# Patient Record
Sex: Female | Born: 1960 | Race: White | Hispanic: No | Marital: Married | Smoking: Never smoker
Health system: Southern US, Community
[De-identification: ages and names within clinical notes are randomized; demographics above are authoritative.]

## PROBLEM LIST (undated history)

## (undated) DIAGNOSIS — E559 Vitamin D deficiency, unspecified: Secondary | ICD-10-CM

## (undated) DIAGNOSIS — R011 Cardiac murmur, unspecified: Secondary | ICD-10-CM

## (undated) DIAGNOSIS — E669 Obesity, unspecified: Secondary | ICD-10-CM

## (undated) DIAGNOSIS — M436 Torticollis: Secondary | ICD-10-CM

## (undated) DIAGNOSIS — E079 Disorder of thyroid, unspecified: Secondary | ICD-10-CM

## (undated) DIAGNOSIS — R002 Palpitations: Secondary | ICD-10-CM

## (undated) DIAGNOSIS — E538 Deficiency of other specified B group vitamins: Secondary | ICD-10-CM

## (undated) DIAGNOSIS — F411 Generalized anxiety disorder: Secondary | ICD-10-CM

## (undated) DIAGNOSIS — IMO0002 Reserved for concepts with insufficient information to code with codable children: Secondary | ICD-10-CM

## (undated) DIAGNOSIS — N6009 Solitary cyst of unspecified breast: Secondary | ICD-10-CM

## (undated) DIAGNOSIS — G629 Polyneuropathy, unspecified: Secondary | ICD-10-CM

## (undated) HISTORY — DX: Polyneuropathy, unspecified: G62.9

## (undated) HISTORY — DX: Vitamin D deficiency, unspecified: E55.9

## (undated) HISTORY — DX: Generalized anxiety disorder: F41.1

## (undated) HISTORY — DX: Deficiency of other specified B group vitamins: E53.8

## (undated) HISTORY — DX: Cardiac murmur, unspecified: R01.1

## (undated) HISTORY — DX: Palpitations: R00.2

## (undated) HISTORY — DX: Solitary cyst of unspecified breast: N60.09

## (undated) HISTORY — DX: Obesity, unspecified: E66.9

## (undated) HISTORY — DX: Disorder of thyroid, unspecified: E07.9

## (undated) HISTORY — DX: Reserved for concepts with insufficient information to code with codable children: IMO0002

## (undated) HISTORY — DX: Torticollis: M43.6

## (undated) HISTORY — PX: OTHER SURGICAL HISTORY: SHX169

---

## 2004-07-01 ENCOUNTER — Ambulatory Visit: Payer: Self-pay | Admitting: Unknown Physician Specialty

## 2005-10-19 ENCOUNTER — Ambulatory Visit: Payer: Self-pay | Admitting: Unknown Physician Specialty

## 2007-02-03 DIAGNOSIS — N6009 Solitary cyst of unspecified breast: Secondary | ICD-10-CM

## 2007-02-03 HISTORY — DX: Solitary cyst of unspecified breast: N60.09

## 2007-05-28 ENCOUNTER — Emergency Department: Payer: Self-pay | Admitting: Emergency Medicine

## 2007-05-28 ENCOUNTER — Other Ambulatory Visit: Payer: Self-pay

## 2007-12-06 ENCOUNTER — Encounter: Payer: Self-pay | Admitting: Family Medicine

## 2007-12-27 ENCOUNTER — Ambulatory Visit: Payer: Self-pay

## 2008-01-16 ENCOUNTER — Encounter: Payer: Self-pay | Admitting: Family Medicine

## 2009-08-27 ENCOUNTER — Emergency Department: Payer: Self-pay | Admitting: Emergency Medicine

## 2009-08-27 ENCOUNTER — Encounter: Payer: Self-pay | Admitting: Family Medicine

## 2009-08-27 LAB — CONVERTED CEMR LAB
BUN: 8 mg/dL
CO2: 25 meq/L
Chloride: 104 meq/L
Glucose, Bld: 88 mg/dL
HCT: 41.5 %
MCV: 93 fL
Platelets: 339 10*3/uL
Potassium: 3.6 meq/L
Sodium: 138 meq/L

## 2009-08-30 ENCOUNTER — Encounter: Payer: Self-pay | Admitting: Family Medicine

## 2009-08-30 LAB — CONVERTED CEMR LAB
AST: 12 units/L
Albumin: 4 g/dL
Alkaline Phosphatase: 60 units/L
Chloride: 103 meq/L
Creatinine, Ser: 0.7 mg/dL
GFR calc non Af Amer: >60 mL/min
Total Bilirubin: 0.6 mg/dL

## 2009-09-09 ENCOUNTER — Encounter: Payer: Self-pay | Admitting: Family Medicine

## 2009-10-01 ENCOUNTER — Encounter: Payer: Self-pay | Admitting: Family Medicine

## 2009-10-05 ENCOUNTER — Ambulatory Visit: Payer: Self-pay

## 2009-10-05 ENCOUNTER — Encounter: Payer: Self-pay | Admitting: Family Medicine

## 2009-10-10 ENCOUNTER — Telehealth: Payer: Self-pay | Admitting: Family Medicine

## 2009-10-11 ENCOUNTER — Encounter: Payer: Self-pay | Admitting: Family Medicine

## 2009-10-11 ENCOUNTER — Ambulatory Visit: Payer: Self-pay | Admitting: Internal Medicine

## 2009-10-11 DIAGNOSIS — R002 Palpitations: Secondary | ICD-10-CM | POA: Insufficient documentation

## 2009-10-23 ENCOUNTER — Encounter: Payer: Self-pay | Admitting: Family Medicine

## 2009-11-04 ENCOUNTER — Ambulatory Visit: Payer: Self-pay | Admitting: Internal Medicine

## 2009-11-04 DIAGNOSIS — E038 Other specified hypothyroidism: Secondary | ICD-10-CM | POA: Insufficient documentation

## 2009-11-04 DIAGNOSIS — F411 Generalized anxiety disorder: Secondary | ICD-10-CM | POA: Insufficient documentation

## 2009-11-04 DIAGNOSIS — E039 Hypothyroidism, unspecified: Secondary | ICD-10-CM

## 2009-11-05 LAB — CONVERTED CEMR LAB
Free T4: 0.81 ng/dL (ref 0.60–1.60)
T3, Free: 2.9 pg/mL (ref 2.3–4.2)
TSH: 2.15 microintl units/mL (ref 0.35–5.50)

## 2009-11-07 ENCOUNTER — Encounter: Payer: Self-pay | Admitting: Family Medicine

## 2009-11-07 ENCOUNTER — Ambulatory Visit: Payer: Self-pay | Admitting: Internal Medicine

## 2009-11-07 DIAGNOSIS — E538 Deficiency of other specified B group vitamins: Secondary | ICD-10-CM | POA: Insufficient documentation

## 2009-11-07 LAB — CONVERTED CEMR LAB
ALT: 13 units/L (ref 0–35)
BUN: 10 mg/dL (ref 6–23)
Bilirubin, Direct: 0.1 mg/dL (ref 0.0–0.3)
Calcium: 9.4 mg/dL (ref 8.4–10.5)
Creatinine, Ser: 0.7 mg/dL (ref 0.4–1.2)
Folate: 10.2 ng/mL
Magnesium: 1.8 mg/dL (ref 1.5–2.5)
Total Bilirubin: 0.7 mg/dL (ref 0.3–1.2)
Vitamin B-12: 279 pg/mL (ref 211–911)

## 2009-11-08 LAB — CONVERTED CEMR LAB: Vit D, 25-Hydroxy: 30 ng/mL (ref 30–89)

## 2009-11-11 ENCOUNTER — Ambulatory Visit: Payer: Self-pay | Admitting: Internal Medicine

## 2009-11-12 ENCOUNTER — Encounter: Payer: Self-pay | Admitting: Family Medicine

## 2009-11-15 LAB — CONVERTED CEMR LAB: Homocysteine: 5.5 micromoles/L (ref 4.0–15.4)

## 2009-11-18 ENCOUNTER — Ambulatory Visit: Payer: Self-pay | Admitting: Internal Medicine

## 2009-11-19 ENCOUNTER — Telehealth: Payer: Self-pay | Admitting: Family Medicine

## 2009-11-25 ENCOUNTER — Ambulatory Visit: Payer: Self-pay | Admitting: Internal Medicine

## 2009-12-02 ENCOUNTER — Ambulatory Visit: Payer: Self-pay | Admitting: Internal Medicine

## 2009-12-10 ENCOUNTER — Encounter: Payer: Self-pay | Admitting: Family Medicine

## 2009-12-17 ENCOUNTER — Ambulatory Visit: Payer: Self-pay | Admitting: Neurology

## 2009-12-30 ENCOUNTER — Ambulatory Visit: Payer: Self-pay | Admitting: Internal Medicine

## 2009-12-30 DIAGNOSIS — E559 Vitamin D deficiency, unspecified: Secondary | ICD-10-CM | POA: Insufficient documentation

## 2009-12-30 DIAGNOSIS — E669 Obesity, unspecified: Secondary | ICD-10-CM | POA: Insufficient documentation

## 2010-01-12 ENCOUNTER — Encounter: Payer: Self-pay | Admitting: Family Medicine

## 2010-01-29 ENCOUNTER — Ambulatory Visit: Payer: Self-pay | Admitting: Family Medicine

## 2010-01-30 LAB — CONVERTED CEMR LAB
VLDL: 20.6 mg/dL (ref 0.0–40.0)
Vitamin B-12: 840 pg/mL (ref 211–911)

## 2010-02-06 ENCOUNTER — Telehealth: Payer: Self-pay | Admitting: Family Medicine

## 2010-02-10 ENCOUNTER — Ambulatory Visit
Admission: RE | Admit: 2010-02-10 | Discharge: 2010-02-10 | Payer: Self-pay | Source: Home / Self Care | Attending: Family Medicine | Admitting: Family Medicine

## 2010-02-10 ENCOUNTER — Encounter: Payer: Self-pay | Admitting: Family Medicine

## 2010-02-11 LAB — CONVERTED CEMR LAB: Vit D, 25-Hydroxy: 38 ng/mL (ref 30–89)

## 2010-02-16 ENCOUNTER — Encounter: Payer: Self-pay | Admitting: Family Medicine

## 2010-02-16 LAB — CONVERTED CEMR LAB

## 2010-02-16 LAB — HM MAMMOGRAPHY

## 2010-02-17 ENCOUNTER — Ambulatory Visit: Payer: Self-pay | Admitting: Unknown Physician Specialty

## 2010-03-04 NOTE — Miscellaneous (Signed)
Summary: Flowsheet update  Clinical Lists Changes  Observations: Added new observation of TSH: 3.983 microintl units/mL (08/30/2009 8:15) Added new observation of CALCIUM: 9.0 mg/dL (96/05/5407 8:11) Added new observation of ALBUMIN: 4.0 g/dL (91/47/8295 6:21) Added new observation of PROTEIN, TOT: 6.6 g/dL (30/86/5784 6:96) Added new observation of SGPT (ALT): 10 units/L (08/30/2009 8:15) Added new observation of SGOT (AST): 12 units/L (08/30/2009 8:15) Added new observation of ALK PHOS: 60 units/L (08/30/2009 8:15) Added new observation of BILI TOTAL: 0.6 mg/dL (29/52/8413 2:44) Added new observation of GFR: >60 mL/min (08/30/2009 8:15) Added new observation of CREATININE: 0.7 mg/dL (02/04/7251 6:64) Added new observation of BUN: 9 mg/dL (40/34/7425 9:56) Added new observation of BG RANDOM: 90 mg/dL (38/75/6433 2:95) Added new observation of CO2 PLSM/SER: 27.1 meq/L (08/30/2009 8:15) Added new observation of CL SERUM: 103 meq/L (08/30/2009 8:15) Added new observation of K SERUM: 4.7 meq/L (08/30/2009 8:15) Added new observation of NA: 138.0 meq/L (08/30/2009 8:15)

## 2010-03-04 NOTE — Progress Notes (Signed)
Summary: call a nurse   Phone Note Call from Patient   Summary of Call: Triage Record Num: [id number] Operator: Audelia Hives Patient Name: Haley Barnes Call Date & Time: 11/18/2009 5:19:44PM Patient Phone: [phone] PCP: Audrie Gallus. Tower Patient Gender: Female PCP Fax : Patient DOB: Nov 12, 1960 Practice Name: [facility] Reason for Call: Counts calling about feeling anxious, has tingling and numbness in left arm, onset 11/11/09. Had labs drawn on 11/06/09, spoke with Dr. Patrice Paradise on 11/07/09 and she notified him of leg twitching. Has appt with Neuroligist in Dec 2011. Pt very tearful and upset because she does not know whats going on. Symptoms started in July 2011. Emergent s/s for Anxiety and Numbness and Tingling r/o except for see in 72 hours. Pt to call office in am for appt. Lost a friend in June 2011 and her sons friend passed recently. Protocol(s) Used: Hand Non-Injury Protocol(s) Used: Numbness or Tingling Recommended Outcome per Protocol: See Provider within 72 Hours Reason for Outcome: Gradual onset or worsening numbness/tingling Gradual onset (weeks to months) of numbness or tingling of an extremity, one side of body, or face Care Advice:  ~ Protect the patient from falling or other harm. Call provider immediately if numbness or tingling suddenly worsen or cause inability to perform activities of daily living.  ~  ~ SYMPTOM / CONDITION MANAGEMENT  ~ List, or take, all current prescription(s), nonprescription or alternative medication(s) to provider for evaluation. 10/ Initial call taken by: Melody Comas,  November 19, 2009 8:34 AM  Follow-up for Phone Call        can we call patient and see how she's doing?  see if still interested in sooner appt with [facility]. Follow-up by: Eustaquio Boyden  MD,  November 19, 2009 9:01 AM  Additional Follow-up for Phone Call Additional follow up Details #1::        Appt has been rescheduled to Nov 8th at 9:30am. Pt aware of the  rescheduled appt.Daine Gip  November 19, 2009 9:39 AM Additional Follow-up by: Daine Gip,  November 19, 2009 9:39 AM    Additional Follow-up for Phone Call Additional follow up Details #2::    Left message to return my call. Kim Dance CMA [name]  November 19, 2009 3:47 PM  Follow-up by: Janee Morn CMA [name],  November 22, 2009 10:06 AM

## 2010-03-04 NOTE — Assessment & Plan Note (Signed)
Summary: FINGERS TWITCHING/DLO   Vital Signs:  Patient profile:   50 year old female Weight:      203 pounds Temp:     98.6 degrees F oral Pulse rate:   88 / minute Pulse rhythm:   regular BP sitting:   108 / 70  (left arm) Cuff size:   large  Vitals Entered By: Selena Batten Dance CMA (AAMA) (November 07, 2009 9:06 AM) CC: Finger twitching   History of Present Illness: CC: finger twitching  2d h/o left pinky finger twitching.    Not feeling well again.  Feels tired, run down, appetite down.  Not getting restful sleep.  Waking up at night and having trouble falling asleep.  + h/o hands falling asleep, usually 4th and 5th fingers palmar side (hasn't bothered her recently).  R foot with last 2-3 toes with paresthesias intermittently.  Pt recently underwent scare where she was told may have MS, however brain MRI WNL.  No snoring, no PND.  No more palpitaitons.  No new eating habits, no new herbs, supplement, vitamins.  No pain anywhere, back, muscle, joints.  No new rashes, no oral lesions.    Vision overall ok.  Did have episode where felt eyes flashing.  + little streaks in visual field.  To have vision checked Oct 10th.  Depression/anxiety screen: No excessive worrying.  + mom with h/o anxiety attacks.  Concentration ok.  Energy level most days good.  No guilt, No anhedonia.  No SI/HI.  Sleep a bit restless.  Appetite down.    This all started July 2011 at beach, previous w/u has included mono negative, lyme titers negative, BMP, CBC, TSH, UA WNL, B12 borderline low at 305, folate normal, normal brain MRI.  Has had 2 ER visits, w/u by prior PCP and GYN.  Allergies: 1)  ! Septra  Past History:  Past medical, surgical, family and social histories (including risk factors) reviewed for relevance to current acute and chronic problems.  Past Medical History: Reviewed history from 10/24/2009 and no changes required. BMI 33  Past Surgical History: C/S 1990  Stress echo - WNL.  normal LV  systolic fxn, no fixed defect, low probability for ischemia (09/05/2009) normal EKG NSR 74 (09/2009) MRI brain - WNL (10/05/2009)  Family History: Reviewed history from 10/24/2009 and no changes required. F: D 43s MVA M: A HTN  No cancers (no BRCA), DM, CAD/MI, CVA  Social History: Reviewed history from 10/24/2009 and no changes required. no smoking, no EtOH, no rec drugs Occupation: Psychologist, educational, [employer] Lives with husband Onalee Hua) and 2 sons (1990, 37, 53), daughter at [employer], 2 cats, 3 rabbits  Review of Systems       per HPI  Physical Exam  General:  Well-developed,well-nourished,in no acute distress; alert,appropriate and cooperative throughout examination Head:  Normocephalic and atraumatic without obvious abnormalities. No apparent alopecia or balding. Eyes:  No corneal or conjunctival inflammation noted. EOMI. Perrla.  Mouth:  Oral mucosa and oropharynx without lesions or exudates.  Teeth in good repair. Neck:  No deformities, masses, or tenderness noted.  no thyromegaly Lungs:  Normal respiratory effort, chest expands symmetrically. Lungs are clear to auscultation, no crackles or wheezes. Heart:  Normal rate and regular rhythm. S1 and S2 normal without gallop, murmur, click, rub or other extra sounds. Msk:  No deformity or scoliosis noted of thoracic or lumbar spine.   Pulses:  2+ periph pulses Extremities:  No clubbing, cyanosis, edema, or deformity noted with normal full range  of motion of all joints.   Neurologic:  No cranial nerve deficits noted. Station and gait are normal. DTRs are symmetrical throughout. Sensory, motor and coordinative functions appear intact.  strength 5/5 bilaterally, slight diminished strength with abduction of L 5th finger against resistance.  heat/cold sensation intact, vibration sensation intact. Skin:  Intact without suspicious lesions or rashes Psych:  easily tears up   Impression & Recommendations:  Problem # 1:   DISTURBANCE OF SKIN SENSATION (ICD-782.0) referral to neurology for unexplained numbness/weakness sensations in L>R hand, R foot.  normal brain MRI.  electrolyte panel today.  pt remains very concerned with something physically wrong with her.  check B12 again.  last check was 305.  Orders: TLB-BMP (Basic Metabolic Panel-BMET) (80048-METABOL) TLB-Hepatic/Liver Function Pnl (80076-HEPATIC) TLB-Magnesium (Mg) (83735-MG) T-Vitamin D (25-Hydroxy) (96295-28413) TLB-Folic Acid (Folate) (82746-FOL) TLB-B12, Serum-Total ONLY (24401-U27) TLB-Phosphorus (84100-PHOS) Neurology Referral (Neuro)  Problem # 2:  THYROID STIMULATING HORMONE, ABNORMAL (ICD-246.9) TSH 2.15, T3 and T4 WNL, on armour thyroid 30 mg daily.  previously TSH 3.9  Problem # 3:  PALPITATIONS, OCCASIONAL (ICD-785.1) seem to have resolved.  Problem # 4:  ANXIETY (ICD-300.00) again could be stemming from new onset anxiety.  Pt does not believe this is the case, not currently interested in counseling, pharmacotherapy.  Complete Medication List: 1)  Armour Thyroid 30 Mg Tabs (Thyroid) .Marland Kitchen.. 1 by mouth once daily  Patient Instructions: 1)  Blood work today to check Magnesium and Calcium 2)  Good to see you today.  We will work on neurology referral. 3)  Call clinic with questions.  Current Allergies (reviewed today): ! SEPTRA

## 2010-03-04 NOTE — Letter (Signed)
Summary: Surg consult- simple breast cyst.  Earline Mayotte MD  Earline Mayotte MD   Imported By: Lester [place] 10/31/2009 10:45:03  _____________________________________________________________________  External Attachment:    Type:   Image     Comment:   External Document

## 2010-03-04 NOTE — Letter (Signed)
Summary: Neurology/[facility]   Neurology/[facility]   Imported By: Lester [place] 01/04/2010 09:10:13  _____________________________________________________________________  External Attachment:    Type:   Image     Comment:   External Document  Appended Document: Neurology/[facility]     Clinical Lists Changes  Observations: Added new observation of PAST MED HX: BMI 33 neuropathy, r/o cervical myelopathy vs CTS [Dr. Sherryll Burger, [facility]] (01/04/2010 16:26)        Past History:  Past Medical History: BMI 33 neuropathy, r/o cervical myelopathy vs CTS [Dr. Sherryll Burger, [facility]]

## 2010-03-04 NOTE — Assessment & Plan Note (Signed)
Summary: FOLLOW UP / LFW   Vital Signs:  Patient profile:   50 year old female Weight:      202.75 pounds Temp:     98.4 degrees F oral Pulse rate:   80 / minute Pulse rhythm:   regular BP sitting:   104 / 78  (left arm) Cuff size:   large  Vitals Entered By: Selena Batten Dance CMA (AAMA) (December 30, 2009 9:00 AM) CC: 2 month Follow up   History of Present Illness: CC: f/u issues  Saw Dr. Sherryll Burger at [facility] 12/10/2009, did blood work (RPR, Vit D, SIEP, ANA, RF, ESR, CRP (all normal except vit D deficiency)).  had C spine MRI, to return 01/01/2010 for NCS.  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  Still with paresthesias in L arm, especially at night.  has odd sensation in 4th and 5th digits.  No sxs in legs/feet anymore.  On daily Vit B12, weekly Vit D.  Told has neuropathy.  Still on armour thyroid.  as far as energy level, doesn't note change.  feeling ok, no noted anxiety attacks.  no further palpitations/flutters.  No chest pain, SOB.  Routinely has well woman exam at GYN early December.  due for mammo then.  has not had recent FLP.  Current Medications (verified): 1)  Armour Thyroid 30 Mg Tabs (Thyroid) .Marland Kitchen.. 1 By Mouth Once Daily 2)  Vitamin B-12 1000 Mcg Tabs (Cyanocobalamin) .... One Daily 3)  Vitamin D3 50000 Unit Caps (Cholecalciferol) .Marland Kitchen.. 1 By Mouth Every Week For 8 Weeks  Allergies: 1)  ! Septra  Past History:  Past Medical History: BMI 33 neuropathy, currently followed by neuro [Dr. Shah]  Past Surgical History: C/S 1990  Stress echo - WNL.  normal LV systolic fxn, no fixed defect, low probability for ischemia (09/05/2009) normal EKG NSR 74 (09/2009) MRI brain - WNL (10/05/2009) MRI Cspine - per patient report, no spinal stenosis, + DDD and muscle spasm (12/2009)  Social History: Reviewed history from 10/24/2009 and no changes required. 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 Lungs are clear to auscultation, no crackles or wheezes. Heart:  Normal rate and regular rhythm. S1 and S2 normal without gallop, murmur, click, rub or other extra sounds. Msk:  No deformity or scoliosis noted of thoracic or lumbar spine.   Pulses:  2+ periph pulses Extremities:  No clubbing, cyanosis, edema, or deformity noted with normal full range of motion of all joints.   Neurologic:  No cranial nerve deficits noted. Station and gait are normal. Sensory, motor and coordinative functions appear intact.  strength 5/5 BUE, slight diminished strength with abduction of L 5th finger against resistance, o/w intrinsic mm of hands strength equal.  no ulnar subluxation appreciated with elbow flexion/extension Skin:  Intact without suspicious lesions or rashes Psych:  slightly blunted affect, but otherwise pleasant and cooperative, conversant, appropriate   Impression & Recommendations:  Problem # 1:  NEUROPATHY (ICD-355.9) normal brain MRI.  c-spine MRI WNL x degenerative arthritis, ? muscle spasm.  No report yet.  per neuro, CTS vs cervical.  to undergo NCS.  Problem # 2:  OBESITY (ICD-278.00) next visit check FLP.  RTC for CPE.  Ht: 65 (10/11/2009)   Wt: 202.75 (12/30/2009)   BMI: 33.99 (10/11/2009)  Problem # 3:  VITAMIN D DEFICIENCY (ICD-268.9) under replacement per neuro.  Problem # 4:  VITAMIN B12 DEFICIENCY (ICD-266.2) under replacement.  Complete Medication List: 1)  Armour Thyroid 30 Mg Tabs (Thyroid) .Marland Kitchen.. 1 by mouth once daily 2)  Vitamin B-12 1000 Mcg Tabs (Cyanocobalamin) .... One daily 3)  Vitamin D3 50000 Unit Caps (Cholecalciferol) .Marland Kitchen..  1 by mouth every week for 8 weeks  Patient Instructions: 1)  Return in 1 month fasting for blood work - FLP, B12, 278.02, 266.2. 2)  Return in next few months for complete physical. 3)  Good to see you today, call clinic with questions. 4)  We will await records from nerve conduction study.   Orders Added: 1)  Est. Patient Level III [27062]    Current Allergies (reviewed today): ! SEPTRA

## 2010-03-04 NOTE — Assessment & Plan Note (Signed)
Summary: B-12 NURSE VISIT   Allergies: 1)  ! Septra   Complete Medication List: 1)  Armour Thyroid 30 Mg Tabs (Thyroid) .Marland Kitchen.. 1 by mouth once daily 2)  Cyanocobalamin 1000 Mcg/ml Soln (Cyanocobalamin) .... One shot weekly x 4 weeks 3)  Vitamin B-12 1000 Mcg Tabs (Cyanocobalamin) .... One daily

## 2010-03-04 NOTE — Assessment & Plan Note (Signed)
Summary: B-12 per Kim//kad  Nurse Visit   Allergies: 1)  ! Septra  Medication Administration  Injection # 1:    Medication: Vit B12 1000 mcg    Diagnosis: ? of VITAMIN B12 DEFICIENCY (ICD-266.2)    Route: IM    Site: L deltoid    Exp Date: 08/02/2011    Lot #: [id number]    Mfr: American Regent    Comments: Per Dr. Sharen Hones    Patient tolerated injection without complications    Given by: Selena Batten Dance CMA [name] (November 18, 2009 9:17 AM)  Orders Added: 1)  Admin of Therapeutic Inj  intramuscular or subcutaneous [[id number]]

## 2010-03-04 NOTE — Assessment & Plan Note (Signed)
Summary: 3WK FOLLOW UP / LFW   Vital Signs:  Patient profile:   50 year old female Weight:      203 pounds Temp:     98.2 degrees F oral Pulse rate:   68 / minute Pulse rhythm:   regular BP sitting:   108 / 70  (left arm) Cuff size:   large  Vitals Entered By: Selena Batten Dance CMA (AAMA) (November 04, 2009 11:17 AM) CC: 3 week follow up   History of Present Illness: CC: 3wk f/u  feeling much better.  At night feeling numbness, mostly L hand last 2 fingers and back of them.  Not waking up at night.  not progressively getting worse, pt prefers to watch, declines wrist brace.  palpitations - only has had happen about 3-4 times.  Usually happening in evenings.  checking pulse would be 85-86.  not tachycardic.  TSH checked in July, 3.9.  Started on armour thyroid.  asks about continuing it.  Current Medications (verified): 1)  Armour Thyroid 30 Mg Tabs (Thyroid) .Marland Kitchen.. 1 By Mouth Once Daily  Allergies: 1)  ! Septra  Past History:  Past Medical History: Last updated: 10/24/2009 BMI 33  Past Surgical History: Last updated: 10/24/2009 C/S 1990  Stress echo - WNL.  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  no thyromegaly Lungs:  Normal respiratory effort, chest expands symmetrically. Lungs are clear to auscultation, no crackles or wheezes. Heart:  Normal rate and regular rhythm. S1 and S2 normal without gallop, murmur, click, rub or other extra sounds. Psych:  improved  affect, calm, collected.  good eye contact.   Impression & Recommendations:  Problem # 1:  THYROID STIMULATING HORMONE, ABNORMAL (ICD-246.9) check TSH and thyroid function.  currently on armour thyroid.  previously TSH 3.9 (which could be borderline high for patient).  recommend continue armour thyroid for next 2 months, then consider trial off to see how she feels.  Orders: TLB-TSH (Thyroid Stimulating Hormone) (84443-TSH) TLB-T4 (Thyrox), Free [phone]) TLB-T3, Free (Triiodothyronine) (84481-T3FREE)  Problem # 2:  PALPITATIONS, OCCASIONAL (ICD-785.1) no tachycardia associated with these.  just seem like loud heartbeats.  advised to let us know if associated with racing heart.  pt knows how to check own pulse.  Problem # 3:  DISTURBANCE OF SKIN SENSATION (ICD-782.0) improved overall.  sounds like numbness along 1/2 of radial nerve distribution vs some ulnar as well.  offered wrist brace to use at night, pt prefers to monitor for now.  Problem # 4:  ANXIETY (ICD-300.00)  improving with time, prayer, social support.  no meds.    Complete Medication List: 1)  Armour Thyroid 30 Mg Tabs (Thyroid) .Marland Kitchen.. 1 by mouth once daily  Patient Instructions: 1)  I'm glad you're feeling better today. 2)  Please return in 2 months for follow up. 3)  Continue armour thyroid as it may be helping. 4)  Blood work today to check thyroid.  (TSH and actual thyroid hormone). Prescriptions: ARMOUR THYROID 30 MG TABS (THYROID) 1 by mouth once daily  #  30 x 2   Entered and Authorized by:   Eustaquio Boyden  MD   Signed by:   Eustaquio Boyden  MD on 11/04/2009   Method used:   Electronically to        [facility] [phone]* (retail)       [address] [place], [place]  [address]       Ph: [phone] or [phone]       Fax: [phone]   RxID:   [id number]   Prior Medications (reviewed today): ARMOUR THYROID 30 MG TABS (THYROID) 1 by mouth once daily Current  Allergies (reviewed today): ! SEPTRA

## 2010-03-04 NOTE — Assessment & Plan Note (Signed)
Summary: B-12 NURSE VISIT  Nurse Visit   Allergies: 1)  ! Septra  Medication Administration  Injection # 1:    Medication: Vit B12 1000 mcg    Diagnosis: ? of VITAMIN B12 DEFICIENCY (ICD-266.2)    Route: IM    Site: L deltoid    Exp Date: 08/02/2011    Lot #: [id number]    Mfr: American Regent    Comments: Per Dr. Sharen Hones    Patient tolerated injection without complications    Given by: Selena Batten Dance CMA [name] (December 02, 2009 10:19 AM)  Orders Added: 1)  Admin of Therapeutic Inj  intramuscular or subcutaneous [[id number]] 2)  Vit B12 1000 mcg [J3420]

## 2010-03-04 NOTE — Assessment & Plan Note (Signed)
Summary: B-12 per Kim//kad  Nurse Visit   Allergies: 1)  ! Septra  Medication Administration  Injection # 1:    Medication: Vit B12 1000 mcg    Diagnosis: ? of VITAMIN B12 DEFICIENCY (ICD-266.2)    Route: IM    Site: L deltoid    Exp Date: 08/02/2011    Lot #: [id number]    Mfr: American Regent    Comments: Per Dr. Sharen Hones    Patient tolerated injection without complications    Given by: Selena Batten Dance CMA [name] (November 25, 2009 11:26 AM)  Orders Added: 1)  Admin of Therapeutic Inj  intramuscular or subcutaneous [[id number]]
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
## 2010-03-04 NOTE — Miscellaneous (Signed)
  Clinical Lists Changes  Observations: Added new observation of PLATELETK/UL: 339 K/uL (08/27/2009 10:55) Added new observation of MCV: 93 fL (08/27/2009 10:55) Added new observation of HCT: 41.5 % (08/27/2009 10:55) Added new observation of HGB: 13.5 g/dL (16/11/9602 54:09) Added new observation of RBC M/UL: 4.49 M/uL (08/27/2009 10:55) Added new observation of WBC COUNT: 6.0 10*3/microliter (08/27/2009 10:55) Added new observation of CALCIUM: 9.0 mg/dL (81/19/1478 29:56) Added new observation of CREATININE: 0.85 mg/dL (21/30/8657 84:69) Added new observation of BUN: 8 mg/dL (62/95/2841 32:44) Added new observation of BG RANDOM: 88 mg/dL (02/04/7251 66:44) Added new observation of CO2 PLSM/SER: 25 meq/L (08/27/2009 10:55) Added new observation of CL SERUM: 104 meq/L (08/27/2009 10:55) Added new observation of K SERUM: 3.6 meq/L (08/27/2009 10:55) Added new observation of NA: 138 meq/L (08/27/2009 10:55)

## 2010-03-04 NOTE — Letter (Signed)
Summary: [facility]  [facility]   Imported By: Beau Fanny 10/10/2009 16:49:17  _____________________________________________________________________  External Attachment:    Type:   Image     Comment:   External Document  Appended Document: [facility]    Clinical Lists Changes  Observations: Added new observation of HPI: [facility] - R arm weakness, R side paresthesias, lethargy and fatigue.  Workup by previous PCP (Linthavong at [place]) and PA at [facility] included: TSH 3.983, CMP WNL, B12 borderline low at 311, folate normal, brain MRI WNL.  tried on armour thyroid 30mg  daily, no improvement. (10/11/2009 10:53) Added new observation of PAP SMEAR: WNL (02/17/2007 10:58)        -  Date:  02/17/2007    PAP WNL   History of Present Illness: Seen at [facility] - R arm weakness, R side paresthesias, lethargy and fatigue.  Workup by previous PCP (Linthavong at [place]) and PA at [facility] included: TSH 3.983, CMP WNL, B12 borderline low at 311, folate normal, brain MRI WNL.  tried on armour thyroid 30mg  daily, no improvement.

## 2010-03-04 NOTE — Progress Notes (Signed)
  Phone Note Call from Patient Call back at [phone]   Caller: Patient Call For: Dr.Guiterrez Summary of Call: Pt. has a new patient appt. w/ you on 10/23/09.  She was referred to you by [facility].  She is having right arm and leg weakness and numbness for the past month. She is,also,having anxiety since she doesn't know what is causing the weakness and numbness. Can Pt. be seen sooner than 10/23/09? Initial call taken by: Beau Fanny,  October 10, 2009 10:35 AM  Follow-up for Phone Call        can we switch my CPE slot tomorrow at 11:30am to a new patient slot and cancel her new pt appt? Follow-up by: Eustaquio Boyden  MD,  October 10, 2009 10:45 AM  Additional Follow-up for Phone Call Additional follow up Details #1::        I called pt and she switched her appt. to 11:30 tomorrow.  Thank you. Additional Follow-up by: Beau Fanny,  October 10, 2009 10:59 AM    Additional Follow-up for Phone Call Additional follow up Details #2::    thanks. Follow-up by: Eustaquio Boyden  MD,  October 10, 2009 11:06 AM

## 2010-03-04 NOTE — Assessment & Plan Note (Signed)
Summary: B-12 per Selena Batten at 2:15//kad  Nurse Visit   Allergies: 1)  ! Septra  Medication Administration  Injection # 1:    Medication: Vit B12 1000 mcg    Diagnosis: ? of VITAMIN B12 DEFICIENCY (ICD-266.2)    Route: IM    Site: R deltoid    Exp Date: 08/02/2011    Lot #: [id number]    Mfr: [employer] Regent    Comments: Per Dr. Sharen Hones    Patient tolerated injection without complications    Given by: Selena Batten Dance CMA [name] (November 11, 2009 2:46 PM)  Orders Added: 1)  Admin of Therapeutic Inj  intramuscular or subcutaneous [16109]

## 2010-03-04 NOTE — Letter (Signed)
Summary: [facility]  [facility]   Imported By: Lester [place] 10/31/2009 10:48:16  _____________________________________________________________________  External Attachment:    Type:   Image     Comment:   External Document

## 2010-03-05 ENCOUNTER — Other Ambulatory Visit: Payer: Self-pay | Admitting: Family Medicine

## 2010-03-05 ENCOUNTER — Encounter: Payer: Self-pay | Admitting: Family Medicine

## 2010-03-05 ENCOUNTER — Ambulatory Visit: Admit: 2010-03-05 | Payer: Self-pay | Admitting: Family Medicine

## 2010-03-05 ENCOUNTER — Encounter (INDEPENDENT_AMBULATORY_CARE_PROVIDER_SITE_OTHER): Payer: PRIVATE HEALTH INSURANCE | Admitting: Family Medicine

## 2010-03-05 DIAGNOSIS — E538 Deficiency of other specified B group vitamins: Secondary | ICD-10-CM

## 2010-03-05 DIAGNOSIS — E079 Disorder of thyroid, unspecified: Secondary | ICD-10-CM

## 2010-03-05 DIAGNOSIS — R3915 Urgency of urination: Secondary | ICD-10-CM

## 2010-03-05 DIAGNOSIS — Z0000 Encounter for general adult medical examination without abnormal findings: Secondary | ICD-10-CM

## 2010-03-05 LAB — CONVERTED CEMR LAB
Bilirubin Urine: NEGATIVE
Glucose, Urine, Semiquant: NEGATIVE
Ketones, urine, test strip: NEGATIVE
Specific Gravity, Urine: 1.01
pH: 7.5

## 2010-03-05 LAB — HEPATIC FUNCTION PANEL
ALT: 11 U/L (ref 0–35)
Bilirubin, Direct: 0.1 mg/dL (ref 0.0–0.3)
Total Bilirubin: 0.5 mg/dL (ref 0.3–1.2)

## 2010-03-05 LAB — CBC WITH DIFFERENTIAL/PLATELET
Basophils Relative: 0.5 % (ref 0.0–3.0)
Eosinophils Relative: 1.2 % (ref 0.0–5.0)
HCT: 38.3 % (ref 36.0–46.0)
Hemoglobin: 13.1 g/dL (ref 12.0–15.0)
Lymphs Abs: 1.6 10*3/uL (ref 0.7–4.0)
MCV: 93.2 fl (ref 78.0–100.0)
Monocytes Absolute: 0.5 10*3/uL (ref 0.1–1.0)
Neutro Abs: 6 10*3/uL (ref 1.4–7.7)
RBC: 4.1 Mil/uL (ref 3.87–5.11)
WBC: 8.3 10*3/uL (ref 4.5–10.5)

## 2010-03-05 LAB — BASIC METABOLIC PANEL
Chloride: 105 mEq/L (ref 96–112)
Creatinine, Ser: 0.6 mg/dL (ref 0.4–1.2)

## 2010-03-06 ENCOUNTER — Encounter: Payer: Self-pay | Admitting: Family Medicine

## 2010-03-06 NOTE — Consult Note (Signed)
Summary: [facility]  [facility]   Imported By: Lanelle Bal 01/18/2010 10:30:36  _____________________________________________________________________  External Attachment:    Type:   Image     Comment:   External Document

## 2010-03-06 NOTE — Miscellaneous (Signed)
Summary: records from Neuro  Clinical Lists Changes  Observations: Added new observation of PAST MED HX: BMI 33 neuropathy [Dr. Sherryll Burger, [facility]] (01/12/2010 17:25) Added new observation of PAST SURG HX: C/S 1990  Stress echo - WNL.  normal LV systolic fxn, no fixed defect, low probability for ischemia (09/05/2009) normal EKG NSR 74 (09/2009) MRI brain - WNL (10/05/2009) MRI Cspine - no spinal stenosis, + DDD and torticollis, nl cervical cord (12/2009) NCS - normal, no CTS or neuropathy.  L and R ulnar sensory nerves decreased conduction velocity 01/01/2010 (01/12/2010 17:25)       Past History:  Past Medical History: BMI 33 neuropathy [Dr. Sherryll Burger, [facility]]  Past Surgical History: C/S 1990  Stress echo - WNL.  normal LV systolic fxn, no fixed defect, low probability for ischemia (09/05/2009) normal EKG NSR 74 (09/2009) MRI brain - WNL (10/05/2009) MRI Cspine - no spinal stenosis, + DDD and torticollis, nl cervical cord (12/2009) NCS - normal, no CTS or neuropathy.  L and R ulnar sensory nerves decreased conduction velocity 01/01/2010

## 2010-03-06 NOTE — Progress Notes (Signed)
Summary: regarding vitamin D  Phone Note Call from Patient Call back at [phone]   Caller: Patient Call For: Haley Boyden  MD Summary of Call: Pt has been taking vitamin d, 500000 units, prescribed by Dr. Clelia Croft.  She is out of this and is asking if she needs to continue this.  I advised her that she should have her level checked to see where she is at and go from there.  [facility]ses [facility] [address]                 Lowella Petties CMA, AAMA  February 06, 2010 3:48 PM   Follow-up for Phone Call        plz schedule labvisit for [vit D 268.9] or have pt call Dr. Sherryll Burger to schedule lab visit there.  if done here, fax results to Dr. Sherryll Burger.  if good leve, will likely recommend take daily vit D 2000 International Units . Follow-up by: Haley Boyden  MD,  February 06, 2010 4:49 PM  Additional Follow-up for Phone Call Additional follow up Details #1::        Spoke with patient and she prefers the lab here. Appt scheduled for next week. Additional Follow-up by: Janee Morn CMA [name],  February 07, 2010 10:48 AM

## 2010-03-12 NOTE — Assessment & Plan Note (Signed)
Summary: cpe LFW   Vital Signs:  Patient profile:   50 year old female Height:      65 inches Weight:      206.25 pounds BMI:     34.45 Temp:     98.5 degrees F oral Pulse rate:   72 / minute Pulse rhythm:   regular BP sitting:   108 / 70  (left arm) Cuff size:   large  Vitals Entered By: Selena Batten Dance CMA (AAMA) (March 05, 2010 8:32 AM) CC: CPx   History of Present Illness: CC: CPE  had mammogram at [facility] - normal.  gets yearly.    had pap smear at GYN - normal   Feeling better today than has in past - thinks due to Vit D.  Neuropathy gone, never got good answer as to why paresthesias but better on vit D.  B12 repleted as well.  thyroid - continues armour thyroid.  no constipation or diarrhea, no skin changes or hair changes that she's noticed.  no heat/cold intolerance.  no mood issues.    BMI - does get good vegetables in, not that much fruit.  Eats out 2-3 times/wk.  Drinks lots of water, some soda and some sweet tea occasionally throught week.  Does get milk in, sometimes with breakfast.  New puppy, so walks dog several times a day, 20 min walks.  ?UTI - since Monday mild dysuria at end of stream, + polyuria and urgency, not feeling like completely voiding.  no f/c/n/v/back pain, abd pain.  Preventive Screening-Counseling & Management  Alcohol-Tobacco     Smoking Status: never  -  Date:  02/16/2010    Mammogram WNL    PAP WNL  Current Medications (verified): 1)  Armour Thyroid 30 Mg Tabs (Thyroid) .Marland Kitchen.. 1 By Mouth Once Daily 2)  Vitamin B-12 1000 Mcg Tabs (Cyanocobalamin) .... One Daily 3)  Vitamin D 2000 Unit Tabs (Cholecalciferol) .... One Daily 4)  Multivitamins  Tabs (Multiple Vitamin) .... One Centrum Daily  Allergies: 1)  ! Septra  Past History:  Past Medical History: Last updated: 01/12/2010 BMI 33 neuropathy [Dr. Sherryll Burger, [facility]]  Family History: F: D 38s MVA M: A HTN  No cancers (no BRCA, colon, lung), DM, CAD/MI, CVA  Social  History: Reviewed history from 12/30/2009 and no changes required. no smoking, no EtOH, no rec drugs Occupation: Psychologist, educational, [employer] Lives with husband Onalee Hua) and 2 sons (1990, 58, 47), daughter at [employer], 2 cats, 3 rabbitsSmoking Status:  never  Review of Systems  The patient denies anorexia, fever, weight loss, weight gain, vision loss, decreased hearing, hoarseness, chest pain, syncope, dyspnea on exertion, peripheral edema, prolonged cough, headaches, hemoptysis, abdominal pain, melena, hematochezia, severe indigestion/heartburn, hematuria, depression, and breast masses.    Physical Exam  General:  Well-developed,well-nourished,in no acute distress; alert,appropriate and cooperative throughout examination Head:  Normocephalic and atraumatic without obvious abnormalities. No apparent alopecia or balding. Eyes:  No corneal or conjunctival inflammation noted. EOMI. Perrla.  Ears:  TMs clear bilaterally Nose:  nares clear bilaterally Mouth:  MMM, no pharyngeal erythema/edema Neck:  No deformities, masses, or tenderness noted.  no thyromegaly, no LAD, no bruits Lungs:  Normal respiratory effort, chest expands symmetrically. Lungs are clear to auscultation, no crackles or wheezes. Heart:  Normal rate and regular rhythm. S1 and S2 normal without gallop, murmur, click, rub or other extra sounds. Abdomen:  Bowel sounds positive,abdomen soft and non-tender without masses, organomegaly or hernias noted. Pulses:  2+ rad pulses Extremities:  no pedal edema Neurologic:  CN grossly intact, station and gait intact Skin:  Intact without suspicious lesions or rashes Psych:  pleasant and cooperative, conversant, appropriate   Impression & Recommendations:  Problem # 1:  HEALTH MAINTENANCE EXAM (ICD-V70.0) discussed healthy eating as well as living.  lifestyle recommendations discussed.  Problem # 2:  NEUROPATHY (ICD-355.9) resolved on vit D.  Problem # 3:  THYROID  STIMULATING HORMONE, ABNORMAL (ICD-246.9) check TSH, free T4.  discussed option of coming off to see how she does, pt prefers to stay on armour thyroid for now, may reassess after 1 year of treatment (10/2010).  Orders: TLB-BMP (Basic Metabolic Panel-BMET) (80048-METABOL) TLB-Hepatic/Liver Function Pnl (80076-HEPATIC) TLB-TSH (Thyroid Stimulating Hormone) (84443-TSH) TLB-T4 (Thyrox), Free [phone]) Venipuncture (84696)  Problem # 4:  VITAMIN D DEFICIENCY (ICD-268.9) repleted and at goal dose on current supplementation  Problem # 5:  VITAMIN B12 DEFICIENCY (ICD-266.2) at goal on current dose.  Orders: TLB-CBC Platelet - w/Differential (85025-CBCD)  Problem # 6:  OBESITY (ICD-278.00) discussed healthy eating as well as exercise recommendation.  Ht: 65 (03/05/2010)   Wt: 206.25 (03/05/2010)   BMI: 34.45 (03/05/2010)  Problem # 7:  URINARY URGENCY (ICD-788.63) mild UTI on UA.  culture sent.  treat with cipro x 3 days. Orders: UA Dipstick W/ Micro (manual) (29528) Specimen Handling (99000) T-Culture, Urine ([id number])  Complete Medication List: 1)  Multivitamins Tabs (Multiple vitamin) .... One centrum daily 2)  Armour Thyroid 30 Mg Tabs (Thyroid) .Marland Kitchen.. 1 by mouth once daily 3)  Vitamin B-12 1000 Mcg Tabs (Cyanocobalamin) .... One daily 4)  Vitamin D 2000 Unit Tabs (Cholecalciferol) .... One daily 5)  Ciprofloxacin Hcl 500 Mg Tabs (Ciprofloxacin hcl) .... Take one by mouth two times a day x 3 days  Patient Instructions: 1)  Please return in 1 year for next CPE or as needed. 2)  blood work today. 3)  checked urine today. 4)  Recommendation for healthy lifestyles is minimum 150 min activity weekly (split up as you can, may be 8 20 min brisk walking sessions or 3 10 min walks with dog daily). 5)  Good to see you, call clinic with questions. Prescriptions: CIPROFLOXACIN HCL 500 MG TABS (CIPROFLOXACIN HCL) take one by mouth two times a day x 3 days  #6 x 0   Entered and  Authorized by:   Eustaquio Boyden  MD   Signed by:   Eustaquio Boyden  MD on 03/05/2010   Method used:   [facility] to        [facility] [phone]* (retail)       [address] [place], [place]  [address]       Ph: [phone] or [phone]       Fax: [phone]   RxID:   [id number]    Orders Added: 1)  TLB-BMP (Basic Metabolic Panel-BMET) [80048-METABOL] 2)  TLB-Hepatic/Liver Function Pnl [80076-HEPATIC] 3)  TLB-TSH (Thyroid Stimulating Hormone) [84443-TSH] 4)  TLB-CBC Platelet - w/Differential [85025-CBCD] 5)  TLB-T4 (Thyrox), Free [02542-HC6C] 6)  Venipuncture [37628] 7)  Est. Patient 40-64 years [99396] 8)  Est. Patient Level III [31517] 9)  UA Dipstick W/ Micro (manual) [81000] 10)  Specimen Handling [99000] 11)  T-Culture, Urine [[id number]]    Current Allergies (reviewed today): ! SEPTRA   Prevention & Chronic Care Immunizations   Influenza vaccine: Not documented    Tetanus booster: 01/12/2007: given   Tetanus booster due:  01/11/2017    Pneumococcal vaccine: Not documented  Other Screening   Pap smear: WNL  (02/16/2010)   Pap smear due: 03/06/2011    Mammogram: WNL  (02/16/2010)   Mammogram due: 02/17/2011   Smoking status: never  (03/05/2010)  Lipids   Total Cholesterol: 167  (01/29/2010)   LDL: 108  (01/29/2010)   LDL Direct: Not documented   HDL: 38.40  (01/29/2010)   Triglycerides: 103.0  (01/29/2010)  Laboratory Results   Urine Tests  Date/Time Received: March 05, 2010 9:10 AM  Date/Time Reported: March 05, 2010 9:10 AM   Routine Urinalysis   Color: lt. yellow Appearance: Cloudy Glucose: negative   (Normal Range: Negative) Bilirubin: negative   (Normal Range: Negative) Ketone: negative   (Normal Range: Negative) Spec. Gravity: 1.010   (Normal Range: 1.003-1.035) Blood: large   (Normal Range: Negative) pH: 7.5   (Normal Range: 5.0-8.0) Protein: negative   (Normal Range:  Negative) Urobilinogen: 0.2   (Normal Range: 0-1) Nitrite: negative   (Normal Range: Negative) Leukocyte Esterace: moderate   (Normal Range: Negative)  Urine Microscopic WBC/HPF: 1-5 RBC/HPF: 1-5 Bacteria/HPF: 1+ rods Mucous/HPF: no Epithelial/HPF: rare Crystals/HPF: no Casts/LPF: no Yeast/HPF: no    Comments: read by ........................Eustaquio Boyden  MD  March 05, 2010 9:22 AM  UCx sent.

## 2010-04-21 ENCOUNTER — Encounter: Payer: Self-pay | Admitting: Family Medicine

## 2010-04-21 DIAGNOSIS — G629 Polyneuropathy, unspecified: Secondary | ICD-10-CM | POA: Insufficient documentation

## 2011-01-01 ENCOUNTER — Ambulatory Visit: Payer: Self-pay | Admitting: General Surgery

## 2011-01-03 HISTORY — PX: BREAST BIOPSY: SHX20

## 2011-02-09 ENCOUNTER — Other Ambulatory Visit: Payer: Self-pay | Admitting: *Deleted

## 2011-02-09 MED ORDER — THYROID 30 MG PO TABS: 30.0000 mg | ORAL_TABLET | Freq: Every day | ORAL | Status: DC

## 2011-02-09 NOTE — Telephone Encounter (Signed)
Refilled electronically 

## 2011-04-20 ENCOUNTER — Other Ambulatory Visit: Payer: Self-pay | Admitting: *Deleted

## 2011-04-22 ENCOUNTER — Other Ambulatory Visit: Payer: Self-pay | Admitting: *Deleted

## 2011-04-22 MED ORDER — THYROID 30 MG PO TABS: 30.0000 mg | ORAL_TABLET | Freq: Every day | ORAL | Status: DC

## 2011-04-22 NOTE — Telephone Encounter (Signed)
Ok to refill?  Patient has not scheduled a f/u appt.

## 2011-07-23 ENCOUNTER — Ambulatory Visit: Payer: Self-pay | Admitting: General Surgery

## 2011-07-27 ENCOUNTER — Other Ambulatory Visit: Payer: Self-pay | Admitting: *Deleted

## 2011-07-27 NOTE — Telephone Encounter (Signed)
OK to refill? Has not been seen in over 1 year. 

## 2011-07-28 ENCOUNTER — Other Ambulatory Visit: Payer: Self-pay

## 2011-07-28 MED ORDER — THYROID 30 MG PO TABS: 30.0000 mg | ORAL_TABLET | Freq: Every day | ORAL | Status: DC

## 2011-09-01 ENCOUNTER — Encounter: Payer: Self-pay | Admitting: Family Medicine

## 2011-11-25 ENCOUNTER — Encounter: Payer: Self-pay | Admitting: Family Medicine

## 2011-11-25 ENCOUNTER — Telehealth: Payer: Self-pay | Admitting: Family Medicine

## 2011-11-25 NOTE — Telephone Encounter (Signed)
Can we call for update? If weakness resolving, she doesn't need ER evaluation but I'd recommend she return to see Dr. Sherryll Burger neurology at [facility] who she saw in 2011 for similar episodes of numbness.

## 2011-11-25 NOTE — Telephone Encounter (Signed)
Caller: [name]; Patient Name: Haley Barnes; PCP: Eustaquio Boyden [employer]); Best Callback Phone Number: [phone].  LMP 11/16/11. Patient states she awakened with left arm and hand "asleep" at 0500 11/25/11. States hand and arm no longer feel "asleep" but left arm and hand feel weak. Denies numbness or tingling.  Denies weakness of left leg, denies facial drooping. States feels generally weak. Denies dizziness. States is able to ambulate normally. Denies loss of coordination. Denies headache or visual disturbances. Triage per  Neurological Deficit Protocol. 911 disposition obtained related to positive triage assesment for " Weakness that involves any part of the body." Patient declines to call 911. Risk factors of delaying care explained to patient, including possible loss of function/death. Patient informed of importance of immediate evaluation and possible intervention. Patient verbalizes understanding. States she will have her spouse transport her via private vehicle to [facility] ED.

## 2011-11-25 NOTE — Telephone Encounter (Signed)
Message left for patient to return my call. She is on schedule for tomorrow. Advised to call me and advise if she wants to keep that appt or if she prefers to go straight to neuro. Will await return call.

## 2011-11-25 NOTE — Telephone Encounter (Signed)
Spoke with patient and she prefers to keep appt here.

## 2011-11-26 ENCOUNTER — Encounter: Payer: Self-pay | Admitting: Family Medicine

## 2011-11-26 ENCOUNTER — Ambulatory Visit (INDEPENDENT_AMBULATORY_CARE_PROVIDER_SITE_OTHER): Payer: BC Managed Care – PPO | Admitting: Family Medicine

## 2011-11-26 VITALS — BP 122/84 | HR 72 | Temp 98.3°F | Wt 215.8 lb

## 2011-11-26 DIAGNOSIS — Z23 Encounter for immunization: Secondary | ICD-10-CM

## 2011-11-26 DIAGNOSIS — G629 Polyneuropathy, unspecified: Secondary | ICD-10-CM

## 2011-11-26 DIAGNOSIS — E079 Disorder of thyroid, unspecified: Secondary | ICD-10-CM

## 2011-11-26 DIAGNOSIS — E559 Vitamin D deficiency, unspecified: Secondary | ICD-10-CM

## 2011-11-26 DIAGNOSIS — E538 Deficiency of other specified B group vitamins: Secondary | ICD-10-CM

## 2011-11-26 DIAGNOSIS — G589 Mononeuropathy, unspecified: Secondary | ICD-10-CM

## 2011-11-26 NOTE — Patient Instructions (Signed)
Blood work today - thyroid, vitamin B12 , and vit D. We will call you with results. Let me know if these numbness episodes continue.

## 2011-11-26 NOTE — Progress Notes (Signed)
  Subjective:    Patient ID: Haley Barnes, female    DOB: 1961-01-11, 51 y.o.   MRN: [id number]  HPI CC: arm numbness  Yesterday morning woke up around 6am, L arm/hand were numb from digits to mid forearm.  Mostly anterior forearm and 4th/5th digits along ulnar distribution.  After a few minutes sensation returned.  Then throughout the rest of the day, had strange sensation at medial L hand, also felt tired and with low energy.  Today feeling normal.    Taking vitamin D and B12 regularly.  Although did forget to take vitamins the night prior to sxs.  Denies neck pain, fevers/chills, shooting pain down arms.  No recent numbness in last 2 years.  Has been off armour thyroid since 10/07/2011.  Wt Readings from Last 3 Encounters:  11/26/11 215 lb 12 oz (97.864 kg)  03/05/10 206 lb 4 oz (93.554 kg)  12/30/09 202 lb 12 oz (91.967 kg)    Past Medical History  Diagnosis Date  . Neuropathy     Dr. Gerline Legacy  . Anxiety state, unspecified   . Obesity, unspecified   . Palpitations     Occasional  . Unspecified disorder of thyroid   . Other B-complex deficiencies   . Unspecified vitamin D deficiency   . DDD (degenerative disc disease)   . Torticollis      Review of Systems Per HPI    Objective:   Physical Exam  Nursing note and vitals reviewed. Constitutional: She appears well-developed and well-nourished. No distress.  Neck: Normal range of motion. Neck supple.  Musculoskeletal:       FROM at neck, shoulders, arms.   Neg spurling.  Neurological: She has normal strength. No sensory deficit. She exhibits normal muscle tone. She displays a negative Romberg sign.  Reflex Scores:      Bicep reflexes are 2+ on the right side and 2+ on the left side.      Brachioradialis reflexes are 2+ on the right side and 2+ on the left side.      Sensation intact to light touch, temperature, and sharp/soft discrimination 5/5 strength BUE No pain at ulnar tunnel       Assessment & Plan:  Flu  shot today.

## 2011-11-26 NOTE — Assessment & Plan Note (Signed)
Check levels today.  Compliant with B12.

## 2011-11-26 NOTE — Assessment & Plan Note (Signed)
Check levels today. Compliant with vit D.

## 2011-11-26 NOTE — Assessment & Plan Note (Signed)
Recheck thyroid panel today.  If abnl, consider starting armour thyroid.

## 2011-11-26 NOTE — Assessment & Plan Note (Signed)
Anticipate compression at night time of ulnar nerve.   Discussed this. If repeat episode, consider restarting armour thyroid, vs referral back to Dr. Sherryll Burger.

## 2011-11-27 ENCOUNTER — Telehealth: Payer: Self-pay

## 2011-11-27 LAB — VITAMIN D 25 HYDROXY (VIT D DEFICIENCY, FRACTURES): Vit D, 25-Hydroxy: 43 ng/mL (ref 30–89)

## 2011-11-27 MED ORDER — THYROID 30 MG PO TABS: 30.0000 mg | ORAL_TABLET | Freq: Every day | ORAL | Status: DC

## 2011-11-27 NOTE — Telephone Encounter (Signed)
Pt said last night did not rest well, had numbness in rt arm for one hour and then numbness went away. Today feels awful, low energy. Pt understands lab results are not back yet but wants to know since she has been of armour thyroid since 1st of Sept if can go ahead and restart thyroid med. [facility]. Advised labs should be back today but pt does not want to wait for results.Please advise.

## 2011-11-27 NOTE — Telephone Encounter (Signed)
Message left advising patient it was okay to restart med and that it had been sent pharmacy. Advised I would call when labs were resulted.

## 2011-11-27 NOTE — Telephone Encounter (Signed)
Yes may restart.  Still awaiting blood work.  Sent in armour thryoid.

## 2011-11-30 LAB — TSH: TSH: 3.27 u[IU]/mL (ref 0.35–5.50)

## 2011-11-30 NOTE — Telephone Encounter (Signed)
See lab results.  

## 2011-11-30 NOTE — Telephone Encounter (Signed)
Are results back on her labs yet?

## 2011-11-30 NOTE — Telephone Encounter (Signed)
Pt left v/m requesting lab results called to [phone].

## 2013-02-02 HISTORY — PX: HYSTEROSCOPY: SHX211

## 2013-10-11 ENCOUNTER — Ambulatory Visit: Payer: Self-pay | Admitting: Obstetrics & Gynecology

## 2013-10-11 LAB — CBC
HCT: 39.4 % (ref 35.0–47.0)
HGB: 12.8 g/dL (ref 12.0–16.0)
MCH: 30.3 pg (ref 26.0–34.0)
MCHC: 32.6 g/dL (ref 32.0–36.0)
MCV: 93 fL (ref 80–100)
Platelet: 335 10*3/uL (ref 150–440)
RBC: 4.24 10*6/uL (ref 3.80–5.20)
RDW: 13.5 % (ref 11.5–14.5)
WBC: 6.1 10*3/uL (ref 3.6–11.0)

## 2013-10-19 ENCOUNTER — Ambulatory Visit: Payer: Self-pay | Admitting: Obstetrics & Gynecology

## 2013-10-19 HISTORY — PX: DILATION AND CURETTAGE OF UTERUS: SHX78

## 2013-10-22 LAB — PATHOLOGY REPORT

## 2014-05-26 NOTE — Op Note (Signed)
PATIENT NAME:  [name], Haley A MR#:  [id number] DATE OF BIRTH:  [birth year]  DATE OF PROCEDURE:  10/19/2013  PREOPERATIVE DIAGNOSIS:  Menorrhagia, menometrorrhagia, endometrial polyps.   POSTOPERATIVE DIAGNOSIS:  Menorrhagia, menometrorrhagia, endometrial polyps.   PROCEDURE:  Hysteroscopy, dilation and curettage with polypectomy.   SURGEON:  Annamarie MajorPaul Harris, MD.   ANESTHESIA:  General.   ESTIMATED BLOOD LOSS:  Minimal.   COMPLICATIONS:  None.   SPECIMEN:  Endocervical curettage and endometrial curettage with polyps.   FINDINGS:  Several small polyps within the uterus.   DISPOSITION:  To the recovery room in stable condition.   TECHNIQUE:  The patient is prepped and draped in the usual sterile fashion after adequate anesthesia is obtained in the dorsal lithotomy position. Bladder is drained with a Robinson catheter. An endocervical curettage is performed with a Kevorkian curette.  The uterus is sounded to 7 cm.  It is dilated very gently and then a MyoSure device is placed with saline fluid for intrauterine distention and the above-mentioned findings. Using the MyoSure polypectomy device several small polyps are carefully removed down to the basement membrane of the lining of the uterus. Excellent hemostasis is noted. The instrument is removed with approximately deficit of 250 mL.  There is minimal blood loss. The patient goes to the recovery room in stable condition. All sponge, instrument, and needle counts are correct.     ____________________________ R. Annamarie MajorPaul Harris, MD rph:bu D: 10/19/2013 12:46:00 ET T: 10/19/2013 12:58:43 ET JOB#: [id number]  cc: Dierdre Searles. Paul Harris, MD, <Dictator> Nadara MustardOBERT P HARRIS MD ELECTRONICALLY SIGNED 10/20/2013 8:15

## 2014-12-31 LAB — COMPREHENSIVE METABOLIC PANEL
ALT: 11
AST: 18 U/L
Alkaline Phosphatase: 84 U/L
BILIRUBIN TOTAL: 0.4 mg/dL
CREATININE: 0.63
GLUCOSE: 85
POTASSIUM: 4.6 mmol/L
SODIUM: 140

## 2014-12-31 LAB — VITAMIN D 25 HYDROXY (VIT D DEFICIENCY, FRACTURES): VIT D 25 HYDROXY: 27

## 2014-12-31 LAB — CBC
HGB: 13 g/dL
PLATELET COUNT: 369
WBC: 5.2

## 2014-12-31 LAB — LIPID PANEL
Cholesterol: 181
HDL: 50
LDL CALC: 107
Triglycerides: 121

## 2014-12-31 LAB — TSH: TSH: 4.8

## 2015-02-21 LAB — TSH+FREE T4
T4,FREE (DIRECT): 1
TSH: 4.88

## 2015-03-25 ENCOUNTER — Ambulatory Visit (INDEPENDENT_AMBULATORY_CARE_PROVIDER_SITE_OTHER): Payer: BLUE CROSS/BLUE SHIELD | Admitting: Family Medicine

## 2015-03-25 ENCOUNTER — Encounter: Payer: Self-pay | Admitting: Family Medicine

## 2015-03-25 VITALS — BP 108/74 | HR 75 | Temp 98.5°F | Ht 64.5 in | Wt 222.0 lb

## 2015-03-25 DIAGNOSIS — E038 Other specified hypothyroidism: Secondary | ICD-10-CM | POA: Diagnosis not present

## 2015-03-25 DIAGNOSIS — E559 Vitamin D deficiency, unspecified: Secondary | ICD-10-CM | POA: Diagnosis not present

## 2015-03-25 DIAGNOSIS — E039 Hypothyroidism, unspecified: Secondary | ICD-10-CM

## 2015-03-25 NOTE — Progress Notes (Signed)
   BP 108/74 mmHg  Pulse 75  Temp(Src) 98.5 F (36.9 C) (Oral)  Ht 5' 4.5" (1.638 m)  Wt 222 lb (100.699 kg)  BMI 37.53 kg/m2  SpO2 97%  LMP 03/11/2015   CC: re establish care  Subjective:    Patient ID: Haley Barnes, female    DOB: July 19, 1960, 55 y.o.   MRN: [id number]  HPI: [name] is a 55 y.o. female presenting on 03/25/2015 for Establish Care   LMP - currently. Less regular.  Last seen here 05/2011.  Recently seen by OBGYN Dr Arvil Chaco with mildly elevated TSH and low vit D, rec f/u with me for further management. H/o mild thyroid dysfunction prior on armour thyroid. Off this since 2013.   Feeling overall well.  Denies heat or cold intolerance, diarrhea/constipation, skin or hair changes, unexpected weight changes, mood changes.  No radiation exposure  Just restarted vitamin D 1000 IU daily.   Relevant past medical, surgical, family and social history reviewed and updated as indicated. Interim medical history since our last visit reviewed. Allergies and medications reviewed and updated. No current outpatient prescriptions on file prior to visit.   No current facility-administered medications on file prior to visit.    Review of Systems Per HPI unless specifically indicated in ROS section     Objective:    BP 108/74 mmHg  Pulse 75  Temp(Src) 98.5 F (36.9 C) (Oral)  Ht 5' 4.5" (1.638 m)  Wt 222 lb (100.699 kg)  BMI 37.53 kg/m2  SpO2 97%  LMP 03/11/2015  Wt Readings from Last 3 Encounters:  03/25/15 222 lb (100.699 kg)  11/26/11 215 lb 12 oz (97.864 kg)  03/05/10 206 lb 4 oz (93.554 kg)    Physical Exam  Constitutional: She appears well-developed and well-nourished. No distress.  HENT:  Mouth/Throat: Oropharynx is clear and moist. No oropharyngeal exudate.  Eyes: Conjunctivae and EOM are normal. Pupils are equal, round, and reactive to light.  Neck: Normal range of motion. Neck supple. Carotid bruit is not present. No thyromegaly present.    Cardiovascular: Normal rate, regular rhythm, normal heart sounds and intact distal pulses.   No murmur heard. Pulmonary/Chest: Effort normal and breath sounds normal. No respiratory distress. She has no wheezes. She has no rales.  Lymphadenopathy:    She has no cervical adenopathy.  Psychiatric: She has a normal mood and affect.  Nursing note and vitals reviewed.     Assessment & Plan:   Problem List Items Addressed This Visit    Vitamin D deficiency    Receiving supplementation with 1000 IU daily.      Subclinical hypothyroidism - Primary    Reviewed recent labs with patient suggestive of subclinical or borderline hypothyroidism. As she denies any thyroid symptoms, ok to continue monitoring this. Suggested she return in 6 months for lab visit only to recheck TFTs. Pt agrees with plan.          Follow up plan: Return as needed.

## 2015-03-25 NOTE — Progress Notes (Signed)
Pre visit review using our clinic review tool, if applicable. No additional management support is needed unless otherwise documented below in the visit note. 

## 2015-03-25 NOTE — Assessment & Plan Note (Signed)
Receiving supplementation with 1000 IU daily.

## 2015-03-25 NOTE — Patient Instructions (Addendum)
You are doing well today. Return in 6 months for lab visit only to recheck thyroid. If doing well we can monitor yearly. Return as needed Check on mom's thyroid history and let me know.

## 2015-03-25 NOTE — Assessment & Plan Note (Signed)
Reviewed recent labs with patient suggestive of subclinical or borderline hypothyroidism. As she denies any thyroid symptoms, ok to continue monitoring this. Suggested she return in 6 months for lab visit only to recheck TFTs. Pt agrees with plan.

## 2015-04-04 ENCOUNTER — Encounter: Payer: Self-pay | Admitting: *Deleted

## 2015-04-09 ENCOUNTER — Encounter: Payer: Self-pay | Admitting: Family Medicine

## 2015-07-15 ENCOUNTER — Encounter: Payer: Self-pay | Admitting: Family Medicine

## 2015-07-15 ENCOUNTER — Ambulatory Visit (INDEPENDENT_AMBULATORY_CARE_PROVIDER_SITE_OTHER): Payer: BLUE CROSS/BLUE SHIELD | Admitting: Family Medicine

## 2015-07-15 VITALS — BP 136/96 | HR 88 | Temp 98.1°F | Wt 223.5 lb

## 2015-07-15 DIAGNOSIS — F411 Generalized anxiety disorder: Secondary | ICD-10-CM | POA: Diagnosis not present

## 2015-07-15 DIAGNOSIS — G629 Polyneuropathy, unspecified: Secondary | ICD-10-CM | POA: Diagnosis not present

## 2015-07-15 DIAGNOSIS — E559 Vitamin D deficiency, unspecified: Secondary | ICD-10-CM | POA: Diagnosis not present

## 2015-07-15 DIAGNOSIS — E039 Hypothyroidism, unspecified: Secondary | ICD-10-CM

## 2015-07-15 DIAGNOSIS — E038 Other specified hypothyroidism: Secondary | ICD-10-CM | POA: Diagnosis not present

## 2015-07-15 DIAGNOSIS — E538 Deficiency of other specified B group vitamins: Secondary | ICD-10-CM

## 2015-07-15 DIAGNOSIS — R202 Paresthesia of skin: Secondary | ICD-10-CM

## 2015-07-15 LAB — COMPREHENSIVE METABOLIC PANEL
ALT: 8 U/L (ref 0–35)
AST: 14 U/L (ref 0–37)
Albumin: 4.3 g/dL (ref 3.5–5.2)
Alkaline Phosphatase: 63 U/L (ref 39–117)
BUN: 14 mg/dL (ref 6–23)
CO2: 28 mEq/L (ref 19–32)
Calcium: 9.4 mg/dL (ref 8.4–10.5)
Chloride: 104 mEq/L (ref 96–112)
Creatinine, Ser: 0.65 mg/dL (ref 0.40–1.20)
GFR: 101.34 mL/min (ref >60.00)
Glucose, Bld: 86 mg/dL (ref 70–99)
Potassium: 4.1 mEq/L (ref 3.5–5.1)
Sodium: 139 mEq/L (ref 135–145)
Total Bilirubin: 0.4 mg/dL (ref 0.2–1.2)
Total Protein: 7.3 g/dL (ref 6.0–8.3)

## 2015-07-15 LAB — CBC WITH DIFFERENTIAL/PLATELET
BASOS ABS: 0 10*3/uL (ref 0.0–0.1)
BASOS PCT: 0.5 % (ref 0.0–3.0)
EOS ABS: 0.1 10*3/uL (ref 0.0–0.7)
Eosinophils Relative: 1.7 % (ref 0.0–5.0)
HCT: 39 % (ref 36.0–46.0)
Hemoglobin: 13.1 g/dL (ref 12.0–15.0)
LYMPHS ABS: 1.5 10*3/uL (ref 0.7–4.0)
LYMPHS PCT: 28.6 % (ref 12.0–46.0)
MCHC: 33.7 g/dL (ref 30.0–36.0)
MCV: 90.1 fl (ref 78.0–100.0)
MONOS PCT: 8.8 % (ref 3.0–12.0)
Monocytes Absolute: 0.4 10*3/uL (ref 0.1–1.0)
NEUTROS ABS: 3.1 10*3/uL (ref 1.4–7.7)
NEUTROS PCT: 60.4 % (ref 43.0–77.0)
PLATELETS: 295 10*3/uL (ref 150.0–400.0)
RBC: 4.33 Mil/uL (ref 3.87–5.11)
RDW: 13.7 % (ref 11.5–15.5)
WBC: 5.1 10*3/uL (ref 4.0–10.5)

## 2015-07-15 LAB — VITAMIN D 25 HYDROXY (VIT D DEFICIENCY, FRACTURES): VITD: 31.18 ng/mL (ref 30.00–100.00)

## 2015-07-15 LAB — TSH: TSH: 3.62 u[IU]/mL (ref 0.35–4.50)

## 2015-07-15 LAB — VITAMIN B12: Vitamin B-12: 312 pg/mL (ref 211–911)

## 2015-07-15 LAB — SEDIMENTATION RATE: Sed Rate: 18 mm/hr (ref 0–30)

## 2015-07-15 LAB — T4, FREE: FREE T4: 0.79 ng/dL (ref 0.60–1.60)

## 2015-07-15 NOTE — Progress Notes (Signed)
Pre visit review using our clinic review tool, if applicable. No additional management support is needed unless otherwise documented below in the visit note. 

## 2015-07-15 NOTE — Assessment & Plan Note (Signed)
Discussed possible contribution of anxiety to symptoms, discussed healthy stress relieving strategies. Consider effexor trial.

## 2015-07-15 NOTE — Patient Instructions (Signed)
Good to see you today Labs today. Work on stress relieving strategies. If ongoing symptoms - whether ongoing anxiousness or ongoing numbness/weakness - let me know.

## 2015-07-15 NOTE — Assessment & Plan Note (Addendum)
Nonspecific neurological symptoms with a nonfocal neurological exam. Prior eval 2011 unrevealing including MR brain and Cspine.  Will recheck vitamin levels, TSH, ESR and CBC/CMP.  Discussed possible anxiousness contribution to symptoms.  If unrevealing, consider trial effexor vs neurology referral. Pt agrees with plan.

## 2015-07-15 NOTE — Assessment & Plan Note (Signed)
Recheck levels 

## 2015-07-15 NOTE — Progress Notes (Signed)
BP 136/96 mmHg  Pulse 88  Temp(Src) 98.1 F (36.7 C) (Oral)  Wt 223 lb 8 oz (101.379 kg)  LMP 07/01/2015   CC: L arm paresthesia Subjective:    Patient ID: Kalman Shan, female    DOB: 1960/06/01, 55 y.o.   MRN: [id number]  HPI: Haley Barnes is a 55 y.o. female presenting on 07/15/2015 for Extremity Weakness   L arm weakness noted over last 3-4 wks, worse at night time. Describes "wave" of sensation of weakness predominantly affecting dorsal L forearm and wrist and hand. No numbness of arm. Has also noticed R dorsal foot and toe numbness with this. Increasing anxiety from this. Denies change in stress or lifestyle change over last few months. No neck pain, headaches, vision changes, chest pain, dyspnea, palpitations, pedal edema.   Normal MR brain w/ w/o contrast and MR cervical spine w/ w/o contrast (2011).   Perimenopausal. LMP - currently on period. Not as regular.   No known heavy metal exposure.   Relevant past medical, surgical, family and social history reviewed and updated as indicated. Interim medical history since our last visit reviewed. Allergies and medications reviewed and updated. Current Outpatient Prescriptions on File Prior to Visit  Medication Sig  . cholecalciferol (VITAMIN D) 1000 units tablet Take 1,000 Units by mouth daily.   No current facility-administered medications on file prior to visit.    Review of Systems Per HPI unless specifically indicated in ROS section     Objective:    BP 136/96 mmHg  Pulse 88  Temp(Src) 98.1 F (36.7 C) (Oral)  Wt 223 lb 8 oz (101.379 kg)  LMP 07/01/2015  Wt Readings from Last 3 Encounters:  07/15/15 223 lb 8 oz (101.379 kg)  03/25/15 222 lb (100.699 kg)  11/26/11 215 lb 12 oz (97.864 kg)    Physical Exam  Constitutional: She is oriented to person, place, and time. She appears well-developed and well-nourished. No distress.  HENT:  Head: Normocephalic and atraumatic.  Mouth/Throat: Oropharynx is clear and  moist. No oropharyngeal exudate.  Eyes: Conjunctivae and EOM are normal. Pupils are equal, round, and reactive to light. No scleral icterus.  Optic discs seemed brisk bilaterally  Neck: Normal range of motion. Neck supple. No thyromegaly present.  FROM at neck without pain No pain to palpation midline cervical neck  Cardiovascular: Normal rate, regular rhythm, normal heart sounds and intact distal pulses.   No murmur heard. Pulmonary/Chest: Effort normal and breath sounds normal. No respiratory distress. She has no wheezes. She has no rales.  Musculoskeletal: She exhibits no edema.  Neurological: She is alert and oriented to person, place, and time. She has normal strength. No cranial nerve deficit or sensory deficit. She displays a negative Romberg sign. Coordination normal.  Reflex Scores:      Bicep reflexes are 1+ on the right side and 1+ on the left side.      Patellar reflexes are 2+ on the right side and 2+ on the left side. Sensation intact to light touch and temperature Neg tinel/phalen FTN intact EOMI CN 2-12 intact Strength 5/5 BUE, BLE  Psychiatric: Her mood appears anxious.  Nursing note and vitals reviewed.  Results for orders placed or performed in visit on 04/04/15  CBC  Result Value Ref Range   WBC 5.2    HGB 13.0 g/dL   platelet count 369   Comprehensive metabolic panel  Result Value Ref Range   Glucose 85    Creat 0.63  Sodium 140    Potassium 4.6 mmol/L   Total Bilirubin 0.4 mg/dL   Alkaline Phosphatase 84 U/L   AST 18 U/L   ALT 11   Lipid panel  Result Value Ref Range   Cholesterol 181    Triglycerides 121    HDL 50    LDL (calc) 107   TSH  Result Value Ref Range   TSH 4.800   VITAMIN D 25 Hydroxy (Vit-D Deficiency, Fractures)  Result Value Ref Range   Vit D, 25-Hydroxy 27.0   TSH+Free T4  Result Value Ref Range   TSH 4.880    T4,Free (Direct) 1.00       Assessment & Plan:   Problem List Items Addressed This Visit    Subclinical  hypothyroidism    Recheck levels       Relevant Orders   TSH   T3   T4, free   Vitamin B12 deficiency   Relevant Orders   Vitamin B12   Vitamin D deficiency   Relevant Orders   VITAMIN D 25 Hydroxy (Vit-D Deficiency, Fractures)   Anxiety state    Discussed possible contribution of anxiety to symptoms, discussed healthy stress relieving strategies. Consider effexor trial.       Neuropathy ([place])    Non-focal neurological exam. Has previously seen Dr Manuella Ghazi.      Relevant Orders   Comprehensive metabolic panel   CBC with Differential/Platelet   Sedimentation rate   Paresthesia - Primary    Nonspecific neurological symptoms with a nonfocal neurological exam. Prior eval 2011 unrevealing including MR brain and Cspine.  Will recheck vitamin levels, TSH, ESR and CBC/CMP.  Discussed possible anxiousness contribution to symptoms.  If unrevealing, consider trial effexor vs neurology referral. Pt agrees with plan.      Relevant Orders   Comprehensive metabolic panel   CBC with Differential/Platelet   Sedimentation rate       Follow up plan: Return if symptoms worsen or fail to improve.  Ria Bush, MD

## 2015-07-15 NOTE — Assessment & Plan Note (Addendum)
Non-focal neurological exam. Has previously seen Dr Sherryll BurgerShah.

## 2015-07-16 LAB — T3: T3 TOTAL: 96 ng/dL (ref 76–181)

## 2015-07-18 ENCOUNTER — Encounter: Payer: Self-pay | Admitting: *Deleted

## 2015-09-23 ENCOUNTER — Other Ambulatory Visit: Payer: BLUE CROSS/BLUE SHIELD

## 2015-10-29 ENCOUNTER — Ambulatory Visit (INDEPENDENT_AMBULATORY_CARE_PROVIDER_SITE_OTHER): Payer: BLUE CROSS/BLUE SHIELD | Admitting: Family Medicine

## 2015-10-29 ENCOUNTER — Encounter: Payer: Self-pay | Admitting: Family Medicine

## 2015-10-29 DIAGNOSIS — H9202 Otalgia, left ear: Secondary | ICD-10-CM | POA: Diagnosis not present

## 2015-10-29 NOTE — Progress Notes (Signed)
Pre visit review using our clinic review tool, if applicable. No additional management support is needed unless otherwise documented below in the visit note. 

## 2015-10-29 NOTE — Progress Notes (Signed)
L ear discomfort.  Started about 5 days ago.  Some pain in the canal (and along the L TMJ prev).  R side still feels normal.  No FCNAVD.  She doesn't feel sick o/w.  No rhinorrhea.  She has h/o tooth grinding, but hasn't needed a mouth guard.  She is able to pop her ears with Valsalva.  Meds, vitals, and allergies reviewed.   ROS: Per HPI unless specifically indicated in ROS section   GEN: nad, alert and oriented HEENT: mucous membranes moist, tm and canals wnl B, pinna wnl, No TMJ pain.  OP wnl NECK: supple w/o LA

## 2015-10-29 NOTE — Patient Instructions (Signed)
Try tylenol and ice your jaw.   If not better, then ask the dentist about a mouth guard.  Take care.  Glad to see you.

## 2015-10-30 DIAGNOSIS — H9202 Otalgia, left ear: Secondary | ICD-10-CM | POA: Insufficient documentation

## 2015-10-30 NOTE — Assessment & Plan Note (Signed)
Discussed with patient. Her ears canals and drums look fine. The TMJ itself is not tender to palpation. She does not have crepitus on the TMJ with range of motion. It still could be that she is having pain from the muscles of mastication or from the TMJ referred to the left ear. She does have a history of tooth grinding. I would try Tylenol and icing the left TMJ. If not better, then she may need to talk with her dentist. Discussed with patient. She agrees. Update us as needed.

## 2016-02-10 ENCOUNTER — Other Ambulatory Visit: Payer: Self-pay | Admitting: Certified Nurse Midwife

## 2016-02-10 DIAGNOSIS — Z1231 Encounter for screening mammogram for malignant neoplasm of breast: Secondary | ICD-10-CM

## 2016-03-09 ENCOUNTER — Other Ambulatory Visit: Payer: Self-pay | Admitting: Certified Nurse Midwife

## 2016-03-09 ENCOUNTER — Ambulatory Visit
Admission: RE | Admit: 2016-03-09 | Discharge: 2016-03-09 | Disposition: A | Discharge status: Home / Self Care | Payer: No Typology Code available for payment source | Source: Ambulatory Visit | Attending: Certified Nurse Midwife | Admitting: Certified Nurse Midwife

## 2016-03-09 DIAGNOSIS — Z1231 Encounter for screening mammogram for malignant neoplasm of breast: Secondary | ICD-10-CM | POA: Diagnosis not present

## 2017-03-01 ENCOUNTER — Other Ambulatory Visit: Payer: Self-pay | Admitting: Certified Nurse Midwife

## 2017-03-01 DIAGNOSIS — Z1231 Encounter for screening mammogram for malignant neoplasm of breast: Secondary | ICD-10-CM

## 2017-03-08 ENCOUNTER — Ambulatory Visit (INDEPENDENT_AMBULATORY_CARE_PROVIDER_SITE_OTHER): Payer: No Typology Code available for payment source | Admitting: Certified Nurse Midwife

## 2017-03-08 ENCOUNTER — Encounter: Payer: Self-pay | Admitting: Certified Nurse Midwife

## 2017-03-08 VITALS — BP 110/80 | HR 70 | Ht 65.0 in | Wt 232.0 lb

## 2017-03-08 DIAGNOSIS — Z124 Encounter for screening for malignant neoplasm of cervix: Secondary | ICD-10-CM

## 2017-03-08 DIAGNOSIS — Z01419 Encounter for gynecological examination (general) (routine) without abnormal findings: Secondary | ICD-10-CM

## 2017-03-08 DIAGNOSIS — Z1211 Encounter for screening for malignant neoplasm of colon: Secondary | ICD-10-CM | POA: Diagnosis not present

## 2017-03-08 DIAGNOSIS — Z1239 Encounter for other screening for malignant neoplasm of breast: Secondary | ICD-10-CM

## 2017-03-08 DIAGNOSIS — Z1231 Encounter for screening mammogram for malignant neoplasm of breast: Secondary | ICD-10-CM

## 2017-03-08 NOTE — Progress Notes (Signed)
Gynecology Annual Exam  PCP: Ria Bush, MD  Chief Complaint:  Chief Complaint  Patient presents with  . Gynecologic Exam    History of Present Illness:Haley Barnes presents today for her annual exam. She is a 57 year old Caucasian/White female G 3 P 3 0 0 3 who was having monthly lite bleeding lasting a day and not accompanied by cramping at the time of her last annual exam 02/10/2016. The spotting comes less regularly, now every 2-3 months and still lasts 1 day. She is having some hot flashes, usually more at night, but nothing that is disturbing her sleep.    The patient's past medical history is notable for a history of having a D&C and endometrial polypectomy in 2015 for AUB and for obesity.  Since her last annual GYN exam dated 02/10/2016, she saw a cardiologist for a newly discovered murmur and for hypertension. She had an echo which revealed trivial to mild MR and TR. Her blood pressures have been better on a low salt diet.  She is sexually active.   Her most recent pap smear was obtained 02/10/2016 and was NIL/neg HRHPV Her most recent mammogram obtained on 03/09/2016 was normal and revealed no significant changes. There is no family history of breast cancer. There is no family history of ovarian cancer. The patient does do occ self breast exams.  She denies a recent screening colonoscopy and is eligible.  A DEXA scan is not applicable for this patient.  The patient does not smoke.  The patient does not drink alcohol.  The patient does not use illegal drugs.  The patient does not exercise.  The patient may get adequate calcium in her diet.  She had a recent cholesterol screen in 2016 that was normal.   The patient denies current symptoms of depression.    Review of Systems: ROS  Past Medical History:  Past Medical History:  Diagnosis Date  . Anxiety state, unspecified   . DDD (degenerative disc disease)   . Heart murmur   . Neuropathy    Dr. Greggory Keen  .  Obesity, unspecified   . Other B-complex deficiencies   . Palpitations    Occasional  . Solitary cyst of breast 2009   left  . Torticollis   . Unspecified disorder of thyroid   . Unspecified vitamin D deficiency     Past Surgical History:  Past Surgical History:  Procedure Laterality Date  . BREAST BIOPSY Left 01/2011   benign/ Dr Bary Castilla  . CESAREAN SECTION  1990  . DILATION AND CURETTAGE OF UTERUS  10/19/2013   with hysteroscopy and polypectomy  . HYSTEROSCOPY  2015   Dr Kenton Kingfisher  . wisdom teeth removal      Family History:  Family History  Problem Relation Age of Onset  . Hypertension Mother   . Thyroid disease Mother   . Cancer Neg Hx        breast,colon,lung  . Diabetes Neg Hx   . Heart attack Neg Hx   . Stroke Neg Hx     Social History:  Social History   Socioeconomic History  . Marital status: Married    Spouse name: Shanon Brow  . Number of children: 3  . Years of education: Not on file  . Highest education level: Not on file  Social Needs  . Financial resource strain: Not on file  . Food insecurity - worry: Not on file  . Food insecurity - inability: Not on file  .  Transportation needs - medical: Not on file  . Transportation needs - non-medical: Not on file  Occupational History  . Occupation: Dealer  Tobacco Use  . Smoking status: Never Smoker  . Smokeless tobacco: Never Used  Substance and Sexual Activity  . Alcohol use: No    Alcohol/week: 0.0 oz  . Drug use: No  . Sexual activity: Yes    Partners: Male    Birth control/protection: Post-menopausal  Other Topics Concern  . Not on file  Social History Narrative   Lives with husband Shanon Brow) and 2 sons [phone]), daughter at [employer], 2 cats 3 rabbits    Allergies:  Allergies  Allergen Reactions  . Sulfamethoxazole-Trimethoprim Anxiety    REACTION: anxious REACTION: anxious    Medications: Prior to Admission medications   Medication Sig Start Date End  Date Taking? Authorizing Provider  cholecalciferol (VITAMIN D) 1000 units tablet Take 1,000 Units by mouth daily.    [provider]  cyanocobalamin 1000 MCG tablet Take 1,000 mcg by mouth daily.    [provider]    Physical Exam Vitals: BP 110/80   Pulse 70   Ht _0  (1.651 m)   Wt 232 lb (105.2 kg)   LMP 07/01/2015   BMI 38.61 kg/m   General: WF in NAD HEENT: normocephalic, anicteric Neck: no thyroid enlargement, no palpable nodules, no cervical lymphadenopathy  Pulmonary: No increased work of breathing, CTAB Cardiovascular: RRR, without murmur  Breast: Breast symmetrical, no tenderness, no palpable nodules or masses, no skin or nipple retraction present, no nipple discharge.  No axillary, infraclavicular or supraclavicular lymphadenopathy. Abdomen: Soft, non-tender, non-distended.  Umbilicus without lesions.  No hepatomegaly or masses palpable. No evidence of hernia. Genitourinary:  External: Normal external female genitalia.  Normal urethral meatus, normal Bartholin's and Skene's glands.    Vagina: Normal vaginal mucosa, no evidence of prolapse.    Cervix: Grossly normal in appearance, no bleeding, non-tender  Uterus: Anteverted, normal size, shape, and consistency, mobile, and non-tender  Adnexa: No adnexal masses, non-tender  Rectal: deferred  Lymphatic: no evidence of inguinal lymphadenopathy Extremities: no edema, erythema, or tenderness Neurologic: Grossly intact Psychiatric: mood appropriate, affect full     Assessment: 57 y.o. perimenopausal female for annual gyn exam  Plan:   1) Breast cancer screening - recommend monthly self breast exam and annual mammograms. Mammogram scheduled for 18 February at [facility]  2) Colon cancer screening. Patient desires to do Cologuard. RX for test sent to company. If not covered by insurance-gave collection kit for occult blood test.  3) Cervical cancer screening - Pap was done.   4) Osteoporosis  prevention: Discussed calcium and vitamin D3 requirements. Also explained the role of exercise in preventing osteoporosis and in preventing falls. Given [employer] website  5) Routine healthcare maintenance including cholesterol and diabetes screening managed by PCP   6) RTO 1 year and prn  Dalia Heading, CNM

## 2017-03-08 NOTE — Patient Instructions (Signed)
Preventing Osteoporosis, Adult Osteoporosis is a condition that causes the bones to get weaker. With osteoporosis, the bones become thinner, and the normal spaces in bone tissue become larger. This can make the bones weak and cause them to break more easily. People who have osteoporosis are more likely to break their wrist, spine, or hip. Even a minor accident or injury can be enough to break weak bones. Osteoporosis can occur with aging. Your body constantly replaces old bone tissue with new tissue. As you get older, you may lose bone tissue more quickly, or it may be replaced more slowly. Osteoporosis is more likely to develop if you have poor nutrition or do not get enough calcium or vitamin D. Other lifestyle factors can also play a role. By making some diet and lifestyle changes, you can help to keep your bones healthy and help to prevent osteoporosis. What nutrition changes can be made? Nutrition plays an important role in maintaining healthy, strong bones.  Make sure you get enough calcium every day from food or from calcium supplements. ? If you are age 50 or younger, aim to get 1,000 mg of calcium every day. ? If you are older than age 50, aim to get 1,200 mg of calcium every day.  Try to get enough vitamin D every day. ? If you are age 70 or younger, aim to get 600 international units (IU) every day. ? If you are older than age 70, aim to get 800 international units every day.  Follow a healthy diet. Eat plenty of foods that contain calcium and vitamin D. ? Calcium is in milk, cheese, yogurt, and other dairy products. Some fish and vegetables are also good sources of calcium. Many foods such as cereals and breads have had calcium added to them (are fortified). Check nutrition labels to see how much calcium is in a food or drink. ? Foods that contain vitamin D include milk, cereals, salmon, and tuna. Your body also makes vitamin D when you are out in the sun. Bare skin exposure to the sun on  your face, arms, legs, or back for no more than 30 minutes a day, 2 times per week is more than enough. Beyond that, it is important to use sunblock to protect your skin from sunburn, which increases your risk for skin cancer.  What lifestyle changes can be made? Making changes in your everyday life can also play an important role in preventing osteoporosis.  Stay active and get exercise every day. Ask your health care provider what types of exercise are best for you.  Do not use any products that contain nicotine or tobacco, such as cigarettes and e-cigarettes. If you need help quitting, ask your health care provider.  Limit alcohol intake to no more than 1 drink a day for nonpregnant women and 2 drinks a day for men. One drink equals 12 oz of beer, 5 oz of wine, or 1 oz of hard liquor.  Why are these changes important? Making these nutrition and lifestyle changes can:  Help you develop and maintain healthy, strong bones.  Prevent loss of bone mass and the problems that are caused by that loss, such as broken bones and delayed healing.  Make you feel better mentally and physically.  What can happen if changes are not made? Problems that can result from osteoporosis can be very serious. These may include:  A higher risk of broken bones that are painful and do not heal well.  Physical malformations, such as   a collapsed spine or a hunched back.  Problems with movement.  Where to find support: If you need help making changes to prevent osteoporosis, talk with your health care provider. You can ask for a referral to a diet and nutrition specialist (dietitian) and a physical therapist. Where to find more information: Learn more about osteoporosis from:  NIH Osteoporosis and Related Bone Diseases National Resource Center: www.niams.BonusBrands.chnih.gov/health_info/bone/osteoporosis/osteoporosis_ff.asp  U.S. Office on Women's Health:  SodaWaters.huwww.womenshealth.gov/publications/our-publications/fact-sheet/osteoporosis.html  National Osteoporosis Foundation: NetsBook.itwww.nof.org/patients/what-is-osteoporosis/  Summary  Osteoporosis is a condition that causes weak bones that are more likely to break.  Eating a healthy diet and making sure you get enough calcium and vitamin D can help prevent osteoporosis.  Other ways to reduce your risk of osteoporosis include getting regular exercise and avoiding alcohol and products that contain nicotine or tobacco. This information is not intended to replace advice given to you by your health care provider. Make sure you discuss any questions you have with your health care provider. Document Released: 02/03/2015 Document Revised: 09/30/2015 Document Reviewed: 09/30/2015 Elsevier Interactive Patient Education  2018 [employer] Colorectal Cancer Screening Colorectal cancer screening is a group of tests used to check for colorectal cancer. Colorectal refers to your colon and rectum. Your colon and rectum are located at the end of your large intestine and carry your bowel movements out of your body. Why is colorectal cancer screening done? It is common for abnormal growths (polyps) to form in the lining of your colon, especially as you get older. These polyps can be cancerous or become cancerous. If colorectal cancer is found at an early stage, it is treatable. Who should be screened for colorectal cancer? Screening is recommended for all adults at average risk starting at age 55. Tests may be recommended every 1 to 10 years. Your health care provider may recommend earlier or more frequent screening if you have:  A history of colorectal cancer or polyps.  A family member with a history of colorectal cancer or polyps.  Inflammatory bowel disease, such as ulcerative colitis or Crohn disease.  A type of hereditary colon cancer syndrome.  Colorectal cancer symptoms.  Types of screening tests There are  several types of colorectal screening tests. They include:  Guaiac-based fecal occult blood testing.  Fecal immunochemical test (FIT).  Stool DNA test.  Barium enema.  Virtual colonoscopy.  Sigmoidoscopy. During this test, a sigmoidoscope is used to examine your rectum and lower colon. A sigmoidoscope is a flexible tube with a camera that is inserted through your anus into your rectum and lower colon.  Colonoscopy. During this test, a colonoscope is used to examine your entire colon. A colonoscope is a long, thin, flexible tube with a camera. This test examines your entire colon and rectum.  This information is not intended to replace advice given to you by your health care provider. Make sure you discuss any questions you have with your health care provider. Document Released: 07/09/2009 Document Revised: 08/29/2015 Document Reviewed: 04/27/2013 Elsevier Interactive Patient Education  [employer]2018 Elsevier Inc.

## 2017-03-10 LAB — IGP,RFX APTIMA HPV ALL PTH: PAP Smear Comment: 0

## 2017-03-22 ENCOUNTER — Ambulatory Visit
Admission: RE | Admit: 2017-03-22 | Discharge: 2017-03-22 | Disposition: A | Discharge status: Home / Self Care | Payer: No Typology Code available for payment source | Source: Ambulatory Visit | Attending: Certified Nurse Midwife | Admitting: Certified Nurse Midwife

## 2017-03-22 DIAGNOSIS — Z1231 Encounter for screening mammogram for malignant neoplasm of breast: Secondary | ICD-10-CM | POA: Insufficient documentation

## 2017-04-05 ENCOUNTER — Encounter: Payer: Self-pay | Admitting: Certified Nurse Midwife

## 2017-04-13 LAB — COLOGUARD

## 2017-04-19 ENCOUNTER — Encounter: Payer: Self-pay | Admitting: Certified Nurse Midwife

## 2017-04-19 DIAGNOSIS — Z1211 Encounter for screening for malignant neoplasm of colon: Secondary | ICD-10-CM | POA: Insufficient documentation

## 2017-10-19 ENCOUNTER — Telehealth: Payer: Self-pay | Admitting: Family Medicine

## 2017-10-19 ENCOUNTER — Encounter: Payer: Self-pay | Admitting: Internal Medicine

## 2017-10-19 ENCOUNTER — Ambulatory Visit (INDEPENDENT_AMBULATORY_CARE_PROVIDER_SITE_OTHER): Payer: No Typology Code available for payment source | Admitting: Internal Medicine

## 2017-10-19 VITALS — BP 120/84 | HR 82 | Temp 98.3°F | Wt 235.0 lb

## 2017-10-19 DIAGNOSIS — H60311 Diffuse otitis externa, right ear: Secondary | ICD-10-CM | POA: Diagnosis not present

## 2017-10-19 MED ORDER — NEOMYCIN-POLYMYXIN-HC 3.5-10000-1 OT SUSP: 4.0000 [drp] | Freq: Four times a day (QID) | OTIC | 0 refills | Status: DC

## 2017-10-19 MED ORDER — CIPROFLOXACIN-DEXAMETHASONE 0.3-0.1 % OT SUSP: 4.0000 [drp] | Freq: Two times a day (BID) | OTIC | 0 refills | Status: DC

## 2017-10-19 NOTE — Telephone Encounter (Signed)
Sent in Cortisporin.

## 2017-10-19 NOTE — Progress Notes (Signed)
Subjective:    Patient ID: Haley ReapJo Ann Barnes, female    DOB: 07/07/1960, 57 y.o.   MRN: [id number]  HPI  Pt presents to the clinic today with c/o nasal congestion and right ear pain. She reports this started 1 week ago. She is not able to blow anything out of her nose. She describes the ear pain as fullness, but denies ringing in the ear or loss of hearing. She denies runny nose, sore throat or cough. She has tried Ibuprofen, Sudafed and Mucinex without any relief. She has no history of seasonal allergies. She has not had sick contacts.  Review of Systems  Past Medical History:  Diagnosis Date  . Anxiety state, unspecified   . DDD (degenerative disc disease)   . Heart murmur   . Neuropathy    [facility]. [facility]  . Obesity, unspecified   . Other B-complex deficiencies   . Palpitations    Occasional  . Solitary cyst of breast 2009   left  . Torticollis   . Unspecified disorder of thyroid   . Unspecified vitamin D deficiency     Current Outpatient Medications  Medication Sig Dispense Refill  . Cholecalciferol (VITAMIN D-1000 MAX ST) 1000 units tablet Take by mouth.    . vitamin B-12 (CYANOCOBALAMIN) 500 MCG tablet Take 500 mcg by mouth daily.     No current facility-administered medications for this visit.     Allergies  Allergen Reactions  . Sulfamethoxazole-Trimethoprim Anxiety    REACTION: anxious REACTION: anxious    Family History  Problem Relation Age of Onset  . Hypertension Mother   . Thyroid disease Mother   . Cancer Neg Hx        breast,colon,lung  . Diabetes Neg Hx   . Heart attack Neg Hx   . Stroke Neg Hx     Social History   Socioeconomic History  . Marital status: Married    Spouse name: Onalee HuaDavid  . Number of children: 3  . Years of education: Not on file  . Highest education level: Not on file  Occupational History  . 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 Financial resource strain: Not on file  . Food insecurity:   Worry: Not on file    Inability: Not on file  . Transportation needs:    Medical: Not on file    Non-medical: Not on file  Tobacco Use  . Smoking status: Never Smoker  . Smokeless tobacco: Never Used  Substance and Sexual Activity  . Alcohol use: No    Alcohol/week: 0.0 standard drinks  . Drug use: No  . Sexual activity: Yes    Partners: Male    Birth control/protection: Post-menopausal  Lifestyle  . Physical activity:    Days per week: 0 days    Minutes per session: 0 min  . Stress: Not at all  Relationships  . Social connections:    Talks on phone: More than three times a week    Gets together: Once a week    Attends religious service: More than 4 times per year    Active member of club or organization: No    Attends meetings of clubs or organizations: Never    Relationship status: Married  . Intimate partner violence:    Fear of current or ex partner: No    Emotionally abused: No    Physically abused: No    Forced sexual activity: No  Other Topics Concern  . Not on file  Social History  Narrative   Lives with husband Onalee Hua) and 2 sons [phone]), daughter at [employer], 2 cats 3 rabbits     Constitutional: Denies fever, malaise, fatigue, headache or abrupt weight changes.  HEENT: Pt reports nasal congestion, right ear pain. Denies eye pain, eye redness,  ringing in the ears, wax buildup, runny nose, bloody nose, or sore throat. Respiratory: Denies difficulty breathing, shortness of breath, cough or sputum production.     No other specific complaints in a complete review of systems (except as listed in HPI above).     Objective:   Physical Exam   BP 120/84   Pulse 82   Temp 98.3 F (36.8 C) (Oral)   Wt 235 lb (106.6 kg)   LMP 07/01/2015   SpO2 98%   BMI 39.11 kg/m  Wt Readings from Last 3 Encounters:  10/19/17 235 lb (106.6 kg)  03/08/17 232 lb (105.2 kg)  10/29/15 227 lb 4 oz (103.1 kg)    General: Appears her stated age, well developed, well  nourished in NAD. HEENT: Head: normal shape and size, no sinus tenderness noted; Left Ear: Tm's gray and intact, normal light reflex; Right Ear: Canal swollen, pus noted in canal. TM's gray and intact, normal light reflex; Nose: mucosa pink and moist, septum midline. Neck:  No adenopathy noted. Pulmonary/Chest: Normal effort and positive vesicular breath sounds. No respiratory distress. No wheezes, rales or ronchi noted.   BMET    Component Value Date/Time   NA 139 07/15/2015 1119   NA 140 12/31/2014   K 4.1 07/15/2015 1119   K 4.6 12/31/2014   CL 104 07/15/2015 1119   CO2 28 07/15/2015 1119   GLUCOSE 86 07/15/2015 1119   BUN 14 07/15/2015 1119   CREATININE 0.65 07/15/2015 1119   CREATININE 0.63 12/31/2014   CALCIUM 9.4 07/15/2015 1119   GFRNONAA 90.69 11/07/2009 0953    Lipid Panel     Component Value Date/Time   CHOL 181 12/31/2014   TRIG 121 12/31/2014   HDL 38.40 (L) 01/29/2010 0902   CHOLHDL 4 01/29/2010 0902   VLDL 20.6 01/29/2010 0902   LDLCALC 107 12/31/2014    CBC    Component Value Date/Time   WBC 5.1 07/15/2015 1119   RBC 4.33 07/15/2015 1119   HGB 13.1 07/15/2015 1119   HGB 13.0 12/31/2014   HCT 39.0 07/15/2015 1119   HCT 39.4 10/11/2013 1243   PLT 295.0 07/15/2015 1119   PLT 335 10/11/2013 1243   MCV 90.1 07/15/2015 1119   MCV 93 10/11/2013 1243   MCH 30.3 10/11/2013 1243   MCHC 33.7 07/15/2015 1119   RDW 13.7 07/15/2015 1119   RDW 13.5 10/11/2013 1243   LYMPHSABS 1.5 07/15/2015 1119   MONOABS 0.4 07/15/2015 1119   EOSABS 0.1 07/15/2015 1119   BASOSABS 0.0 07/15/2015 1119    Hgb A1C No results found for: HGBA1C         Assessment & Plan:   Otitis Externa:  Warm compresses prn Ibuprofen 400 mg every 8 hours as needed eRx for Ciprodex every 4 hours while awake x 5 days Can use Flonase OTC for nasal congestion  Return precautions discussed Nicki Reaper, NP

## 2017-10-19 NOTE — Telephone Encounter (Signed)
Copied from CRM [id number]. Topic: Quick Communication - See Telephone Encounter >> Oct 19, 2017  2:42 PM Arlyss Gandyichardson, Moustapha Tooker N, NT wrote: CRM for notification. See Telephone encounter for: 10/19/17. Pt would like to see if something else can be ordered in place of the ciprofloxacin-dexamethasone (CIPRODEX) OTIC suspension due to the medicine cost $215.

## 2017-10-19 NOTE — Addendum Note (Signed)
Addended by: Lorre MunroeBAITY, Joory Gough W on: 10/19/2017 04:18 PM   Modules accepted: Orders

## 2017-10-19 NOTE — Patient Instructions (Signed)
Otitis Externa Otitis externa is an infection of the outer ear canal. The outer ear canal is the area between the outside of the ear and the eardrum. Otitis externa is sometimes called "swimmer's ear." Follow these instructions at home:  If you were given antibiotic ear drops, use them as told by your doctor. Do not stop using them even if your condition gets better.  Take over-the-counter and prescription medicines only as told by your doctor.  Keep all follow-up visits as told by your doctor. This is important. How is this prevented?  Keep your ear dry. Use the corner of a towel to dry your ear after you swim or bathe.  Try not to scratch or put things in your ear. Doing these things makes it easier for germs to grow in your ear.  Avoid swimming in lakes, dirty water, or pools that may not have the right amount of a chemical called chlorine.  Consider making ear drops and putting 3 or 4 drops in each ear after you swim. Ask your doctor about how you can make ear drops. Contact a doctor if:  You have a fever.  After 3 days your ear is still red, swollen, or painful.  After 3 days you still have pus coming from your ear.  Your redness, swelling, or pain gets worse.  You have a really bad headache.  You have redness, swelling, pain, or tenderness behind your ear. This information is not intended to replace advice given to you by your health care provider. Make sure you discuss any questions you have with your health care provider. Document Released: 07/08/2007 Document Revised: 02/14/2015 Document Reviewed: 10/29/2014 Elsevier Interactive Patient Education  2018 Elsevier Inc.  

## 2017-10-28 ENCOUNTER — Telehealth: Payer: Self-pay | Admitting: Family Medicine

## 2017-10-28 NOTE — Telephone Encounter (Signed)
Copied from CRM [id number]. Topic: General - Other >> Oct 28, 2017 10:55 AM Leafy Ro wrote: Reason for CRM: pt saw regina on 10-19-17 for ear pain. Pt was given rx for ear pain. The pt is no longer having ear pain however she is having muffler sounds in both ears. Pt still has head congestion. [facility] [address] in [place]. Please advise. Pt does not want to make another office visit due to insurance

## 2017-10-29 NOTE — Telephone Encounter (Signed)
She needs to use Flonase OTC BID x 3 days then daily thereafter

## 2017-11-03 ENCOUNTER — Other Ambulatory Visit (HOSPITAL_COMMUNITY)
Admission: RE | Admit: 2017-11-03 | Discharge: 2017-11-03 | Disposition: A | Discharge status: Home / Self Care | Payer: PRIVATE HEALTH INSURANCE | Source: Ambulatory Visit | Attending: Certified Nurse Midwife | Admitting: Certified Nurse Midwife

## 2017-11-03 ENCOUNTER — Encounter

## 2017-11-03 ENCOUNTER — Encounter: Payer: Self-pay | Admitting: Certified Nurse Midwife

## 2017-11-03 ENCOUNTER — Ambulatory Visit (INDEPENDENT_AMBULATORY_CARE_PROVIDER_SITE_OTHER): Payer: No Typology Code available for payment source | Admitting: Certified Nurse Midwife

## 2017-11-03 ENCOUNTER — Ambulatory Visit (INDEPENDENT_AMBULATORY_CARE_PROVIDER_SITE_OTHER): Payer: No Typology Code available for payment source

## 2017-11-03 VITALS — BP 120/70 | HR 78 | Ht 65.0 in | Wt 221.0 lb

## 2017-11-03 DIAGNOSIS — Z3202 Encounter for pregnancy test, result negative: Secondary | ICD-10-CM | POA: Diagnosis not present

## 2017-11-03 DIAGNOSIS — N939 Abnormal uterine and vaginal bleeding, unspecified: Secondary | ICD-10-CM

## 2017-11-03 DIAGNOSIS — N852 Hypertrophy of uterus: Secondary | ICD-10-CM

## 2017-11-03 LAB — POCT URINE PREGNANCY: Preg Test, Ur: NEGATIVE

## 2017-11-03 MED ORDER — PROGESTERONE MICRONIZED 200 MG PO CAPS: 400.0000 mg | ORAL_CAPSULE | Freq: Every day | ORAL | 0 refills | Status: DC

## 2017-11-03 NOTE — Progress Notes (Signed)
Obstetrics & Gynecology Office Visit   Chief Complaint:  Chief Complaint  Patient presents with  . Gynecologic Exam    bleeding for 16d    History of Present Illness: Haley Barnes is a 57 year old Caucasian/White female G 3 P 3 0 0 3 who presents for a follow up to her pelvic ultrasound she had today and for an endometrial biopsy. She was having irregular menses every 2-3 months and lite bleeding lasting a day and not accompanied by cramping. Her LMP 10/18/2017 has been lasting 16 days and was a moderate flow initially (changed pad 2x/day) which lasted 5-6 days and now her bleeding is light but persists. Denies cramping or fever. Had some breast tenderness at the start of this menses. Not using anything for contraception. Has infrequent sexual intercourse. The patient's past medical history is notable for a history of having a D&C and endometrial polypectomy in 2015 for AUB  Review of Systems:  Review of Systems  Constitutional: Positive for weight loss (intentional weight loss of 12# since 03/2017). Negative for chills and fever.  HENT: Positive for congestion. Negative for sinus pain and sore throat.        Ears feel blocked  Eyes: Negative for blurred vision and pain.  Respiratory: Negative for hemoptysis, shortness of breath and wheezing.   Cardiovascular: Negative for chest pain, palpitations and leg swelling.  Gastrointestinal: Negative for abdominal pain, blood in stool, diarrhea, heartburn, nausea and vomiting.  Genitourinary: Negative for dysuria, frequency, hematuria and urgency.       Positive for menorrhagia  Musculoskeletal: Negative for back pain, joint pain and myalgias.  Skin: Negative for itching and rash.  Neurological: Negative for dizziness, tingling and headaches.  Endo/Heme/Allergies: Negative for environmental allergies and polydipsia. Does not bruise/bleed easily.       Positive for hot flashes   Psychiatric/Behavioral: Negative for depression. The patient  is not nervous/anxious and does not have insomnia.      Past Medical History:  Past Medical History:  Diagnosis Date  . Anxiety state, unspecified   . DDD (degenerative disc disease)   . Heart murmur   . Neuropathy    Dr. Gerline Legacy  . Obesity, unspecified   . Other B-complex deficiencies   . Palpitations    Occasional  . Solitary cyst of breast 2009   left  . Torticollis   . Unspecified disorder of thyroid   . Unspecified vitamin D deficiency     Past Surgical History:  Past Surgical History:  Procedure Laterality Date  . BREAST BIOPSY Left 01/2011   benign/ Dr Lemar Livings  . CESAREAN SECTION  1990  . DILATION AND CURETTAGE OF UTERUS  10/19/2013   with hysteroscopy and polypectomy  . HYSTEROSCOPY  2015   Dr Tiburcio Pea  . wisdom teeth removal      Gynecologic History: Patient's last menstrual period was 07/01/2015.  Obstetric History: [id number]  Family History:  Family History  Problem Relation Age of Onset  . Hypertension Mother   . Thyroid disease Mother   . Cancer Neg Hx        breast,colon,lung  . Diabetes Neg Hx   . Heart attack Neg Hx   . Stroke Neg Hx     Social History:  Social History   Socioeconomic History  . Marital status: Married    Spouse name: Onalee Hua  . Number of children: 3  . Years of education: Not on file  . Highest education level: Not on file  Occupational History  . Occupation: Garment/textile technologist  Social Needs  . Financial resource strain: Not on file  . Food insecurity:    Worry: Not on file    Inability: Not on file  . Transportation needs:    Medical: Not on file    Non-medical: Not on file  Tobacco Use  . Smoking status: Never Smoker  . Smokeless tobacco: Never Used  Substance and Sexual Activity  . Alcohol use: No    Alcohol/week: 0.0 standard drinks  . Drug use: No  . Sexual activity: Yes    Partners: Male    Birth control/protection: Post-menopausal  Lifestyle  . Physical activity:    Days per  week: 0 days    Minutes per session: 0 min  . Stress: Not at all  Relationships  . Social connections:    Talks on phone: More than three times a week    Gets together: Once a week    Attends religious service: More than 4 times per year    Active member of club or organization: No    Attends meetings of clubs or organizations: Never    Relationship status: Married  . Intimate partner violence:    Fear of current or ex partner: No    Emotionally abused: No    Physically abused: No    Forced sexual activity: No  Other Topics Concern  . Not on file  Social History Narrative   Lives with husband Onalee Hua) and 2 sons [phone]), daughter at [employer], 2 cats 3 rabbits    Allergies:  Allergies  Allergen Reactions  . Sulfamethoxazole-Trimethoprim Anxiety    REACTION: anxious REACTION: anxious    Medications: Prior to Admission medications   Medication Sig Start Date End Date Taking? Authorizing Provider  Cholecalciferol (VITAMIN D-1000 MAX ST) 1000 units tablet Take by mouth.   Yes [provider]  neomycin-polymyxin-hydrocortisone (CORTISPORIN) 3.5-10000-1 OTIC suspension Place 4 drops into the right ear 4 (four) times daily. 10/19/17  Yes Baity, Salvadore Oxford, NP  vitamin B-12 (CYANOCOBALAMIN) 500 MCG tablet Take 500 mcg by mouth daily.   Yes [provider]    Physical Exam Vitals: BP 120/70   Pulse 78   Ht 5\' 5"  (1.651 m)   Wt 100.2 kg   LMP 07/01/2015   BMI 36.78 kg/m    Ultrasound today reveals an enlarged uterus 11.6 x 6.3 x 6.4cm with a hypervascular anterior uterus. Echo texture is heterogenous without evidence of focal masses Endometrium measures 15.9 mm Ovaries are not seen and there is no FF in the cul de sac   Physical Exam   Assessment: 57 y.o. [id number] with abnormal uterine bleeding  Plan: Discussed options for further evaluation of AUB: endometrial biopsy, hysteroscopy and D&C. Patient will be losing her health care coverage in the next 2  days. Would like to do endometrial biopsy. See procedure note below. Farrel Conners, CNM  Plan: Endometrial Biopsy After discussion with the patient regarding her abnormal uterine bleeding I recommended that she proceed with an endometrial biopsy for further diagnosis. The risks, benefits, alternatives, and indications for an endometrial biopsy were discussed with the patient in detail. She understood the risks including infection, bleeding, cervical laceration and uterine perforation.  Verbal and written consent was obtained. Pregnancy test was negative  PROCEDURE NOTE:  Pipelle endometrial biopsy was performed using aseptic technique with iodine preparation.  The uterus was sounded to a length of 6 cm.  Adequate sampling was obtained with minimal blood loss.  The patient tolerated the procedure well.  RX fro Prometrium 400 mgm qhs x 10 days. Keep menstrual calendar. Farrel Conners, CNM

## 2017-11-05 ENCOUNTER — Telehealth: Payer: Self-pay | Admitting: Family Medicine

## 2017-11-05 NOTE — Telephone Encounter (Signed)
Left detailed msg on VM per HIPAA  

## 2017-11-05 NOTE — Telephone Encounter (Signed)
Copied from CRM [id number]. Topic: Quick Communication - See Telephone Encounter >> Nov 05, 2017 12:15 PM Lorrine Kin, NT wrote: CRM for notification. See Telephone encounter for: 11/05/17. Patient returning call to [facility]. Please advise.

## 2017-11-08 ENCOUNTER — Telehealth: Payer: Self-pay | Admitting: Certified Nurse Midwife

## 2017-11-08 NOTE — Telephone Encounter (Signed)
Called patient with endometrial biopsy results: inactive endometrium. No atypia or malignancy seen. Her bleeding stopped after day 2 of her progesterone. Discussed case with Dr Tiburcio Pea who recommends sonohystogram. Patient with no insurance currently-wants to see how she does after the progesterone. Advised to keep menstrual calendar and return to office in 3-4 months or sooner prn. Farrel Conners, CNM

## 2017-11-09 DIAGNOSIS — N939 Abnormal uterine and vaginal bleeding, unspecified: Secondary | ICD-10-CM | POA: Insufficient documentation

## 2018-01-10 IMAGING — MG MM DIGITAL SCREENING BILAT W/ TOMO W/ CAD
8 of 12 series · 8 of 28 positions shown · non-contrast
Comparison: Previous exam(s).

CLINICAL DATA: Screening.

EXAM:
2D DIGITAL SCREENING BILATERAL MAMMOGRAM WITH CAD AND ADJUNCT TOMO

[R CC synth-2D]
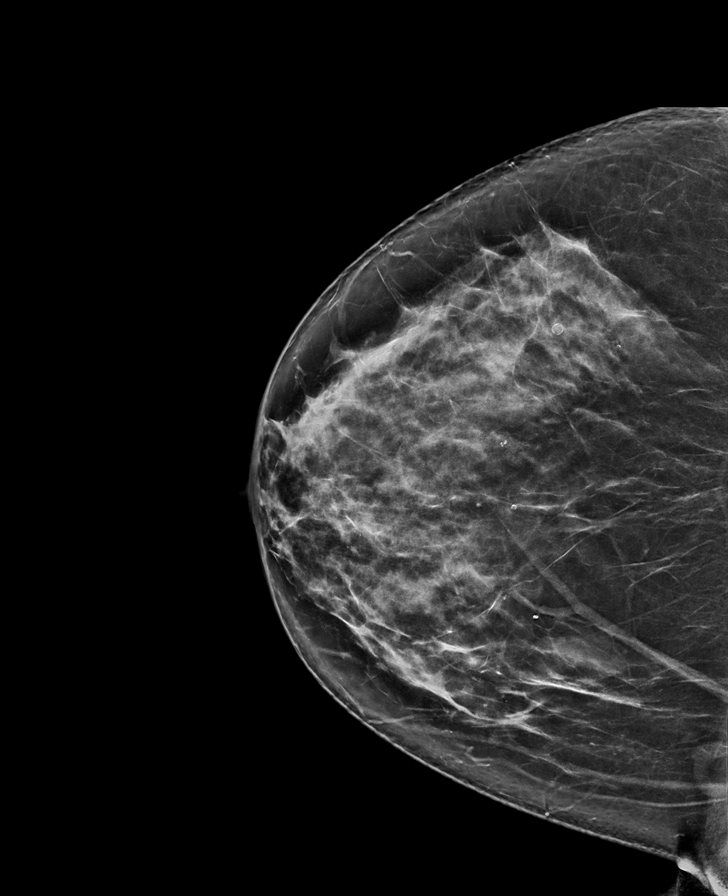

[R MLO synth-2D]
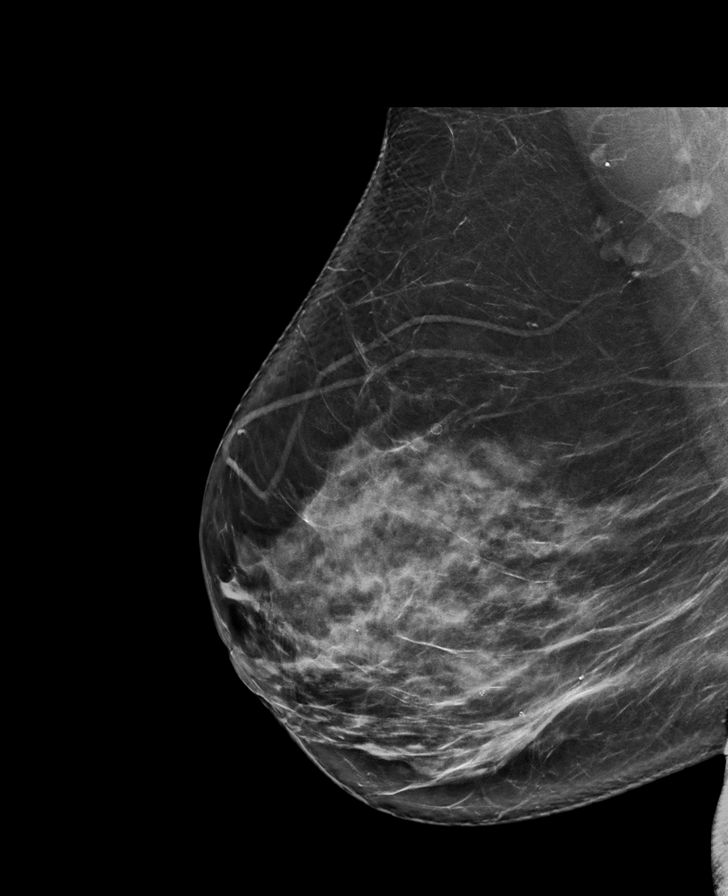

[L CC]
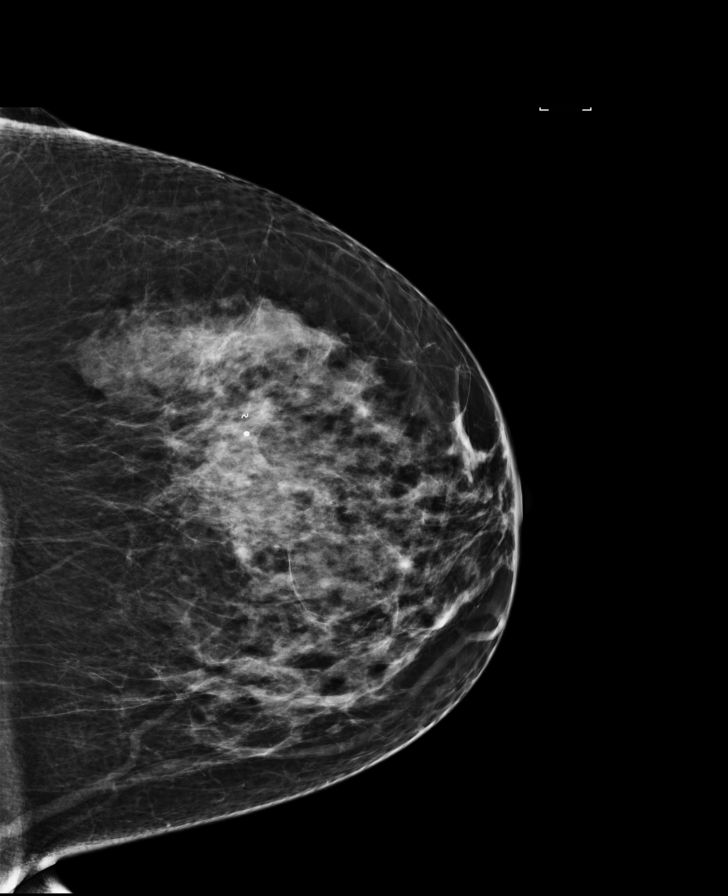

[R MLO]
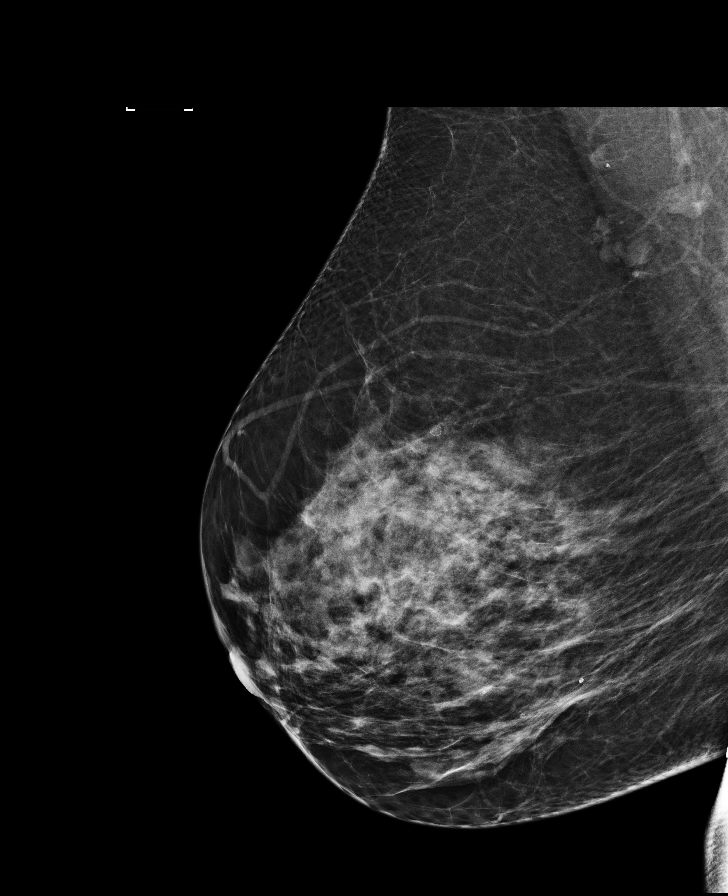

[R CC]
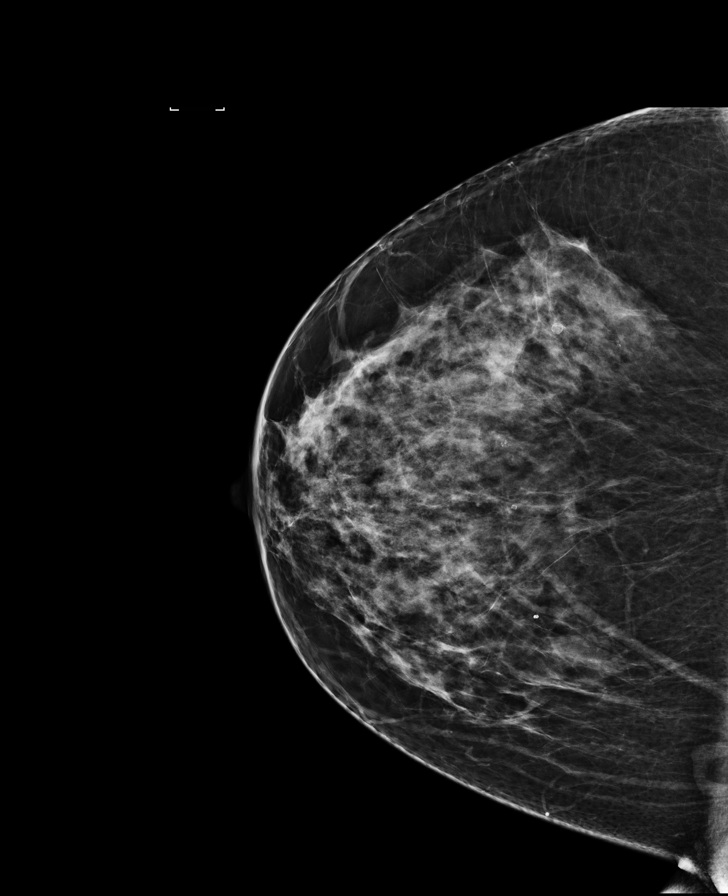

[L MLO]
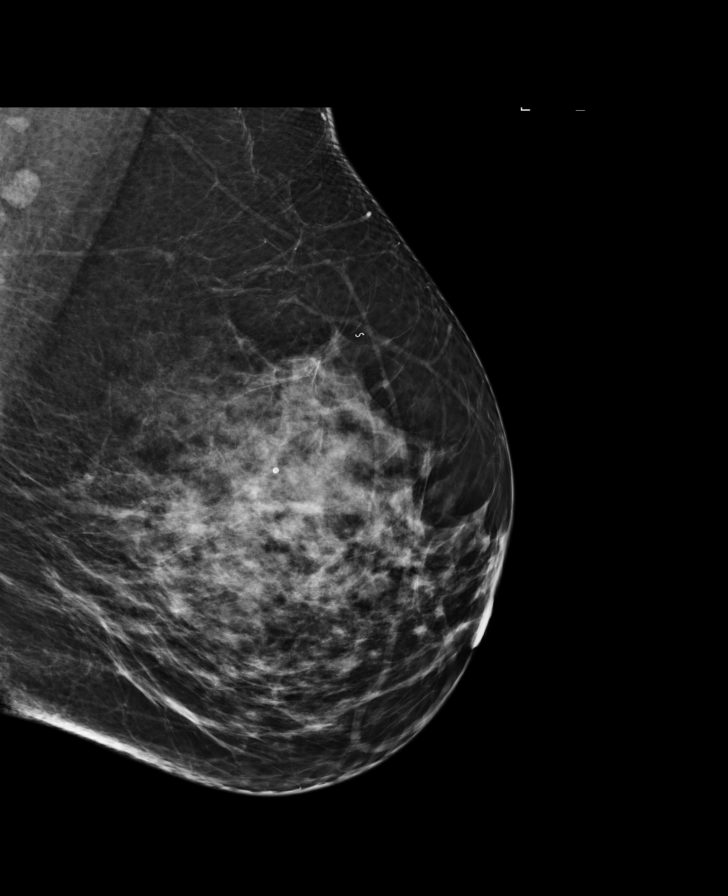

[L CC synth-2D]
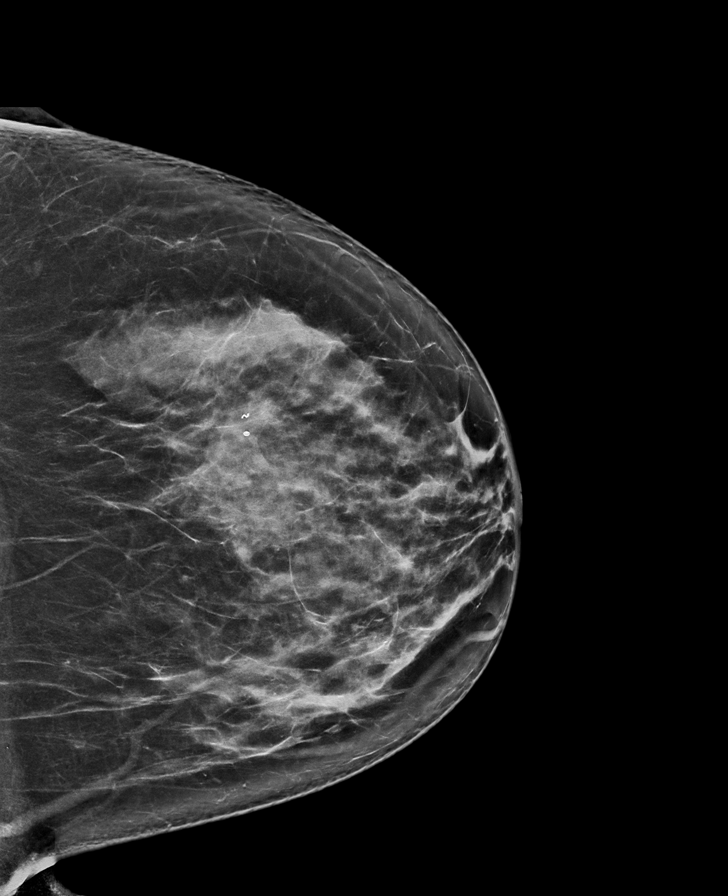

[L MLO synth-2D]
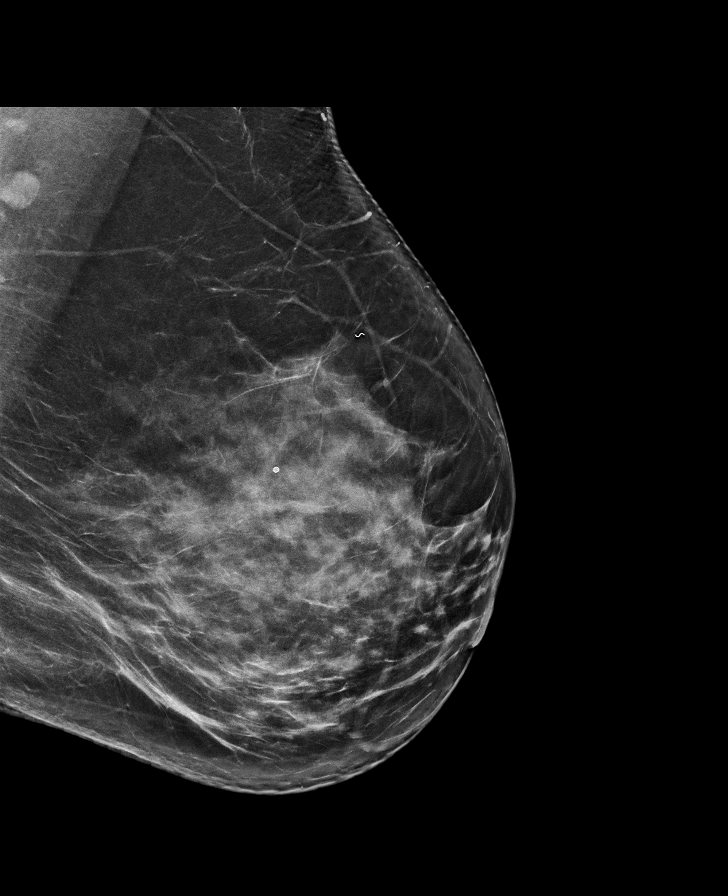

[8 of 28 positions shown; findings below may reference images not displayed]

ACR Breast Density Category c: The breast tissue is heterogeneously
dense, which may obscure small masses.
FINDINGS: There are no findings suspicious for malignancy. Images were
processed with CAD.
IMPRESSION: No mammographic evidence of malignancy. A result letter of this
screening mammogram will be mailed directly to the patient.

RECOMMENDATION:
Screening mammogram in one year. (Code:[id number])

BI-RADS CATEGORY  1: Negative.

## 2018-04-15 ENCOUNTER — Telehealth: Payer: Self-pay | Admitting: Certified Nurse Midwife

## 2018-04-15 NOTE — Telephone Encounter (Signed)
Patient is calling wanting to know who we recommend for varicose veins. Please advise

## 2018-04-19 NOTE — Telephone Encounter (Signed)
Patient is aware 

## 2018-04-19 NOTE — Telephone Encounter (Signed)
Hi there, We recommend [facility]  in [place]. [phone]. Take care, [facility]

## 2019-04-11 ENCOUNTER — Other Ambulatory Visit (HOSPITAL_COMMUNITY)
Admission: RE | Admit: 2019-04-11 | Discharge: 2019-04-11 | Disposition: A | Discharge status: Home / Self Care | Payer: 59 | Source: Ambulatory Visit | Attending: Certified Nurse Midwife | Admitting: Certified Nurse Midwife

## 2019-04-11 ENCOUNTER — Encounter: Payer: Self-pay | Admitting: Certified Nurse Midwife

## 2019-04-11 ENCOUNTER — Other Ambulatory Visit: Payer: Self-pay

## 2019-04-11 ENCOUNTER — Ambulatory Visit (INDEPENDENT_AMBULATORY_CARE_PROVIDER_SITE_OTHER): Payer: 59 | Admitting: Certified Nurse Midwife

## 2019-04-11 VITALS — BP 118/70 | HR 66 | Ht 64.0 in | Wt 172.0 lb

## 2019-04-11 DIAGNOSIS — Z124 Encounter for screening for malignant neoplasm of cervix: Secondary | ICD-10-CM | POA: Insufficient documentation

## 2019-04-11 DIAGNOSIS — Z131 Encounter for screening for diabetes mellitus: Secondary | ICD-10-CM

## 2019-04-11 DIAGNOSIS — R232 Flushing: Secondary | ICD-10-CM | POA: Diagnosis not present

## 2019-04-11 DIAGNOSIS — Z01419 Encounter for gynecological examination (general) (routine) without abnormal findings: Secondary | ICD-10-CM

## 2019-04-11 DIAGNOSIS — Z1239 Encounter for other screening for malignant neoplasm of breast: Secondary | ICD-10-CM

## 2019-04-11 DIAGNOSIS — Z13220 Encounter for screening for lipoid disorders: Secondary | ICD-10-CM

## 2019-04-11 NOTE — Progress Notes (Signed)
Gynecology Annual Exam  PCP: Eustaquio Boyden, MD  Chief Complaint:  Chief Complaint  Patient presents with  . Gynecologic Exam    History of Present Illness:Haley Barnes presents today for her annual exam. She is a 59 year old Caucasian/White female G 3 P 3 0 0 3 whose last menstrual period was more than a year ago.  She is having some hot flushes but nothing that is disturbing her sleep.    The patient's past medical history is notable for a history of having a D&C and endometrial polypectomy in 2015 for AUB and for obesity.  Since her last annual GYN exam dated 03/08/2017, she was seen at [facility] in October for AUB and had an ultrasound and aendometrial biopsy. THe endometrial strip measured 15.64mm and the biopsy revealed inactive endometrium. She took progesterone to stop the prolonged bleeding she was having and has not had a menses since then.  She has a history of vitamin B12 and vitamin D deficiencies, anxiety, obesity, hypertension and a heart murmur. She had an echo which revealed trivial to mild MR and TR. In the past year she has also lost 60-66# by following [employer].   She is sexually active. Denies dtspareunia  Her most recent pap smear was obtained 03/08/2017 and was NIL. Her most recent mammogram obtained on 03/22/2017 was normal and revealed no significant changes. There is no family history of breast cancer. There is no family history of ovarian cancer. The patient does do occ self breast exams.  She had a Cologuard 04/05/2017 which was negative.  A DEXA scan is not applicable for this patient.  The patient does not smoke.  The patient does not drink alcohol.  The patient does not use illegal drugs.  The patient does not exercise.  The patient may get adequate calcium in her diet.  She had a recent cholesterol screen in 2016 that was normal.   The patient denies current symptoms of depression.    Review of Systems: Review of Systems  Constitutional:  Positive for weight loss (intentional). Negative for chills and fever.  HENT: Negative for congestion, sinus pain and sore throat.   Eyes: Negative for blurred vision and pain.  Respiratory: Negative for hemoptysis, shortness of breath and wheezing.   Cardiovascular: Negative for chest pain, palpitations and leg swelling.  Gastrointestinal: Negative for abdominal pain, blood in stool, diarrhea, heartburn, nausea and vomiting.  Genitourinary: Negative for dysuria, frequency, hematuria and urgency.  Musculoskeletal: Negative for back pain, joint pain and myalgias.  Skin: Negative for itching and rash.  Neurological: Negative for dizziness, tingling and headaches.  Endo/Heme/Allergies: Negative for environmental allergies and polydipsia. Does not bruise/bleed easily.       Negative for hirsutism. Positive for hot flashes   Psychiatric/Behavioral: Negative for depression. The patient is not nervous/anxious and does not have insomnia.     Past Medical History:  Past Medical History:  Diagnosis Date  . Anxiety state, unspecified   . DDD (degenerative disc disease)   . Heart murmur   . Neuropathy    Dr. Gerline Legacy  . Obesity, unspecified   . Other B-complex deficiencies   . Palpitations    Occasional  . Solitary cyst of breast 2009   left  . Torticollis   . Unspecified disorder of thyroid   . Unspecified vitamin D deficiency     Past Surgical History:  Past Surgical History:  Procedure Laterality Date  . BREAST BIOPSY Left 01/2011  benign/ Dr Lemar Livings  . CESAREAN SECTION  1990  . DILATION AND CURETTAGE OF UTERUS  10/19/2013   with hysteroscopy and polypectomy  . HYSTEROSCOPY  2015   Dr Tiburcio Pea  . wisdom teeth removal      Family History:  Family History  Problem Relation Age of Onset  . Hypertension Mother   . Thyroid disease Mother   . Cancer Neg Hx        breast,colon,lung  . Diabetes Neg Hx   . Heart attack Neg Hx   . Stroke Neg Hx     Social History:   Social History   Socioeconomic History  . Marital status: Married    Spouse name: Onalee Hua  . Number of children: 3  . Years of education: Not on file  . Highest education level: Not on file  Occupational History  . Occupation: Garment/textile technologist  Tobacco Use  . Smoking status: Never Smoker  . Smokeless tobacco: Never Used  Substance and Sexual Activity  . Alcohol use: No    Alcohol/week: 0.0 standard drinks  . Drug use: No  . Sexual activity: Yes    Partners: Male    Birth control/protection: None, Post-menopausal  Other Topics Concern  . Not on file  Social History Narrative   Lives with husband Onalee Hua). Has  2 sons [phone]), and daughter.   Social Determinants of Health   Financial Resource Strain:   . Difficulty of Paying Living Expenses:   Food Insecurity:   . Worried About Programme researcher, broadcasting/film/video in the Last Year:   . Barista in the Last Year:   Transportation Needs:   . Freight forwarder (Medical):   Marland Kitchen Lack of Transportation (Non-Medical):   Physical Activity:   . Days of Exercise per Week:   . Minutes of Exercise per Session:   Stress:   . Feeling of Stress :   Social Connections:   . Frequency of Communication with Friends and Family:   . Frequency of Social Gatherings with Friends and Family:   . Attends Religious Services:   . Active Member of Clubs or Organizations:   . Attends Banker Meetings:   Marland Kitchen Marital Status:   Intimate Partner Violence:   . Fear of Current or Ex-Partner:   . Emotionally Abused:   Marland Kitchen Physically Abused:   . Sexually Abused:     Allergies:  Allergies  Allergen Reactions  . Sulfamethoxazole-Trimethoprim Anxiety    REACTION: anxious REACTION: anxious    Medications: Prior to Admission medications   Medication Sig Start Date End Date Taking? Authorizing Provider  cholecalciferol (VITAMIN D) 1000 units tablet Take 1,000 Units by mouth daily.    [provider]   cyanocobalamin 1000 MCG tablet Take 1,000 mcg by mouth daily.    [provider]    Physical Exam Vitals: BP 118/70   Pulse 66   Ht 5\' 4"  (1.626 m)   Wt 172 lb (78 kg)   LMP 07/01/2015   BMI 29.52 kg/m   General: WF in NAD HEENT: normocephalic, anicteric Neck: no thyroid enlargement, no palpable nodules, no cervical lymphadenopathy  Pulmonary: No increased work of breathing, CTAB Cardiovascular: RRR, with Grade II/VI systolic murmur at LSB  Breast: Breast symmetrical, no tenderness, no palpable nodules or masses, no skin or nipple retraction present, no nipple discharge.  No axillary, infraclavicular or supraclavicular lymphadenopathy. Abdomen: Soft, non-tender, non-distended.  Umbilicus without lesions.  No hepatomegaly or masses palpable. No evidence of  hernia. Genitourinary:  External: Normal external female genitalia.  Normal urethral meatus, normal Bartholin's and Skene's glands.    Vagina: Normal vaginal mucosa  Cervix: Grossly normal in appearance, no bleeding, non-tender  Uterus: Anteverted, normal size, shape, and consistency, mobile, and non-tender  Adnexa: No adnexal masses, non-tender  Rectal: deferred  Lymphatic: no evidence of inguinal lymphadenopathy Extremities: no edema, erythema, or tenderness Neurologic: Grossly intact Psychiatric: mood appropriate, affect full     Assessment: 59 y.o. postmenopausal female for annual gyn exam  Plan:   1) Breast cancer screening - recommend monthly self breast exam and annual mammograms. Mammogram ordered. Patient to schedule  2) Colon cancer screening.Cologuard UTD. Next due 04/2020  3) Cervical cancer screening - Pap was done.   4) Osteoporosis prevention: Discussed calcium and vitamin D3 requirements. Also explained the role of exercise in preventing osteoporosis and preserving muscle  5) Routine healthcare maintenance including cholesterol and diabetes screening ordered today  6) RTO 1 year and prn   Dalia Heading, CNM

## 2019-04-12 LAB — HEMOGLOBIN A1C
Est. average glucose Bld gHb Est-mCnc: 103 mg/dL
Hgb A1c MFr Bld: 5.2 % (ref 4.8–5.6)

## 2019-04-12 LAB — LIPID PANEL
Chol/HDL Ratio: 3.2 ratio (ref 0.0–4.4)
Cholesterol, Total: 171 mg/dL (ref 100–199)
HDL: 53 mg/dL (ref >39)
LDL Chol Calc (NIH): 96 mg/dL (ref 0–99)
Triglycerides: 126 mg/dL (ref 0–149)
VLDL Cholesterol Cal: 22 mg/dL (ref 5–40)

## 2019-04-13 LAB — CYTOLOGY - PAP
Comment: NEGATIVE
Diagnosis: NEGATIVE
High risk HPV: NEGATIVE

## 2019-04-16 ENCOUNTER — Encounter: Payer: Self-pay | Admitting: Certified Nurse Midwife

## 2019-04-17 ENCOUNTER — Encounter: Payer: Self-pay | Admitting: Certified Nurse Midwife

## 2019-05-16 ENCOUNTER — Ambulatory Visit
Admission: RE | Admit: 2019-05-16 | Discharge: 2019-05-16 | Disposition: A | Discharge status: Home / Self Care | Payer: 59 | Source: Ambulatory Visit | Attending: Certified Nurse Midwife | Admitting: Certified Nurse Midwife

## 2019-05-16 DIAGNOSIS — Z1231 Encounter for screening mammogram for malignant neoplasm of breast: Secondary | ICD-10-CM | POA: Insufficient documentation

## 2019-05-16 DIAGNOSIS — Z1239 Encounter for other screening for malignant neoplasm of breast: Secondary | ICD-10-CM

## 2020-05-29 NOTE — Progress Notes (Addendum)
PCP: Eustaquio Boyden, MD   Chief Complaint  Patient presents with  . Gynecologic Exam    No concerns    HPI:      Ms. Haley Barnes is a 60 y.o. [id number] whose LMP was Patient's last menstrual period was 07/01/2015., presents today for her annual examination.  Her menses are absent due to menopause. No PMB. Hx of PMB 2019 with GYN ultrasound and endometrial biopsy due to thickened EM=15.84mm; biopsy revealed inactive endometrium. She took progesterone to stop the prolonged bleeding with sx relief. Also S/p D&C and endometrial polypectomy in 2015 for AUB. She does have tolerable vasomotor sx.   Sex activity: single partner, contraception - post menopausal status. She does not have vaginal dryness.  Last Pap: 04/11/19  Results were: no abnormalities /neg HPV DNA.   Last mammogram: 05/16/19  Results were: normal--routine follow-up in 12 months FH unknown.  The patient does not do self-breast exams.  Colonoscopy: never; had neg Cologuard 3/19; repeat due after 3 yrs. Would like to do again.  Tobacco use: The patient denies current or previous tobacco use. Alcohol use: none  No drug use. Exercise: not active  She does get adequate calcium but not Vitamin D in her diet.  No recent labs with PCP.  Has issues with bilat leg varicose veins. No swelling or pain. Hasn't tried compression socks yet. Would like info on vein clinics.   Past Medical History:  Diagnosis Date  . Anxiety state, unspecified   . DDD (degenerative disc disease)   . Heart murmur   . Neuropathy    Dr. Gerline Legacy  . Obesity, unspecified   . Other B-complex deficiencies   . Palpitations    Occasional  . Solitary cyst of breast 2009   left  . Torticollis   . Unspecified disorder of thyroid   . Unspecified vitamin D deficiency     Past Surgical History:  Procedure Laterality Date  . BREAST BIOPSY Left 01/2011   benign/ Dr Lemar Livings  . CESAREAN SECTION  1990  . DILATION AND CURETTAGE OF UTERUS   10/19/2013   with hysteroscopy and polypectomy  . HYSTEROSCOPY  2015   Dr Tiburcio Pea  . wisdom teeth removal      Family History  Problem Relation Age of Onset  . Hypertension Mother   . Thyroid disease Mother   . Cancer Neg Hx        breast,colon,lung  . Diabetes Neg Hx   . Heart attack Neg Hx   . Stroke Neg Hx     Social History   Socioeconomic History  . Marital status: Married    Spouse name: Onalee Hua  . Number of children: 3  . Years of education: Not on file  . Highest education level: Not on file  Occupational History  . Occupation: Garment/textile technologist  Tobacco Use  . Smoking status: Never Smoker  . Smokeless tobacco: Never Used  Vaping Use  . Vaping Use: Never used  Substance and Sexual Activity  . Alcohol use: No    Alcohol/week: 0.0 standard drinks  . Drug use: No  . Sexual activity: Yes    Partners: Male    Birth control/protection: None, Post-menopausal  Other Topics Concern  . Not on file  Social History Narrative   Lives with husband Onalee Hua). Has  2 sons [phone]), and daughter.   Social Determinants of Health   Financial Resource Strain: Not on file  Food Insecurity: Not on file  Transportation Needs: Not on  file  Physical Activity: Not on file  Stress: Not on file  Social Connections: Not on file  Intimate Partner Violence: Not on file     Current Outpatient Medications:  .  Cholecalciferol 25 MCG (1000 UT) tablet, Take by mouth. (Patient not taking: Reported on 05/30/2020), Disp: , Rfl:  .  vitamin B-12 (CYANOCOBALAMIN) 500 MCG tablet, Take 500 mcg by mouth daily. (Patient not taking: Reported on 05/30/2020), Disp: , Rfl:      ROS:  Review of Systems  Constitutional: Negative for fatigue, fever and unexpected weight change.  Respiratory: Negative for cough, shortness of breath and wheezing.   Cardiovascular: Negative for chest pain, palpitations and leg swelling.  Gastrointestinal: Negative for blood in stool,  constipation, diarrhea, nausea and vomiting.  Endocrine: Negative for cold intolerance, heat intolerance and polyuria.  Genitourinary: Negative for dyspareunia, dysuria, flank pain, frequency, genital sores, hematuria, menstrual problem, pelvic pain, urgency, vaginal bleeding, vaginal discharge and vaginal pain.  Musculoskeletal: Negative for back pain, joint swelling and myalgias.  Skin: Negative for rash.  Neurological: Negative for dizziness, syncope, light-headedness, numbness and headaches.  Hematological: Negative for adenopathy.  Psychiatric/Behavioral: Negative for agitation, confusion, sleep disturbance and suicidal ideas. The patient is not nervous/anxious.   BREAST: No symptoms   Objective: BP 130/90   Ht 5\' 4"  (1.626 m)   Wt 209 lb (94.8 kg)   LMP 07/01/2015   BMI 35.87 kg/m    Physical Exam Constitutional:      Appearance: She is well-developed.  Genitourinary:     Vulva normal.     Right Labia: No rash, tenderness or lesions.    Left Labia: No tenderness, lesions or rash.    No vaginal discharge, erythema or tenderness.      Right Adnexa: not tender and no mass present.    Left Adnexa: not tender and no mass present.    No cervical friability or polyp.     Uterus is not enlarged or tender.  Breasts:     Right: No mass, nipple discharge, skin change or tenderness.     Left: No mass, nipple discharge, skin change or tenderness.    Neck:     Thyroid: No thyromegaly.  Cardiovascular:     Rate and Rhythm: Normal rate and regular rhythm.     Heart sounds: Normal heart sounds. No murmur heard.   Pulmonary:     Effort: Pulmonary effort is normal.     Breath sounds: Normal breath sounds.  Abdominal:     Palpations: Abdomen is soft.     Tenderness: There is no abdominal tenderness. There is no guarding or rebound.  Musculoskeletal:        General: Normal range of motion.     Cervical back: Normal range of motion.  Lymphadenopathy:     Cervical: No cervical  adenopathy.  Neurological:     General: No focal deficit present.     Mental Status: She is alert and oriented to person, place, and time.     Cranial Nerves: No cranial nerve deficit.  Skin:    General: Skin is warm and dry.  Psychiatric:        Mood and Affect: Mood normal.        Behavior: Behavior normal.        Thought Content: Thought content normal.        Judgment: Judgment normal.  Vitals reviewed.     Assessment/Plan:  Encounter for annual routine gynecological examination  Encounter for screening mammogram  for malignant neoplasm of breast - Plan: MM 3D SCREEN BREAST BILATERAL; pt to sched mammo  Screening for colon cancer - Plan: Cologuard; colonoscopy/cologuard discussed. Pt elects cologuard. Ref sent. Will f/u with results.  Blood tests for routine general physical examination - Plan: Comprehensive metabolic panel, Lipid panel, Hemoglobin A1c  Screening cholesterol level - Plan: Lipid panel  Screening for diabetes mellitus - Plan: Comprehensive metabolic panel, Hemoglobin A1c  BMI 35.0-35.9,adult - Plan: Comprehensive metabolic panel, Lipid panel, Hemoglobin A1c  Recommended Dr. Jimmie Molly at [facility] in [place] for varicose veins. Start compression socks.         GYN counsel breast self exam, mammography screening, menopause, adequate intake of calcium and vitamin D, diet and exercise    F/U  Return in about 1 year (around 05/30/2021).  Haley Cherry B. Briget Shaheed, PA-C 05/30/2020 10:18 AM

## 2020-05-29 NOTE — Patient Instructions (Addendum)
I value your feedback and you entrusting us with your care. If you get a [facility] patient survey, I would appreciate you taking the time to let us know about your experience today. Thank you!  [facility]: [phone]  [facility]: [phone]    

## 2020-05-30 ENCOUNTER — Other Ambulatory Visit: Payer: Self-pay

## 2020-05-30 ENCOUNTER — Encounter: Payer: Self-pay | Admitting: Obstetrics and Gynecology

## 2020-05-30 ENCOUNTER — Ambulatory Visit (INDEPENDENT_AMBULATORY_CARE_PROVIDER_SITE_OTHER): Payer: BC Managed Care – PPO | Admitting: Obstetrics and Gynecology

## 2020-05-30 VITALS — BP 130/90 | Ht 64.0 in | Wt 209.0 lb

## 2020-05-30 DIAGNOSIS — Z13220 Encounter for screening for lipoid disorders: Secondary | ICD-10-CM

## 2020-05-30 DIAGNOSIS — Z131 Encounter for screening for diabetes mellitus: Secondary | ICD-10-CM

## 2020-05-30 DIAGNOSIS — Z0000 Encounter for general adult medical examination without abnormal findings: Secondary | ICD-10-CM

## 2020-05-30 DIAGNOSIS — E669 Obesity, unspecified: Secondary | ICD-10-CM | POA: Insufficient documentation

## 2020-05-30 DIAGNOSIS — Z1231 Encounter for screening mammogram for malignant neoplasm of breast: Secondary | ICD-10-CM | POA: Diagnosis not present

## 2020-05-30 DIAGNOSIS — Z1211 Encounter for screening for malignant neoplasm of colon: Secondary | ICD-10-CM | POA: Diagnosis not present

## 2020-05-30 DIAGNOSIS — Z6835 Body mass index (BMI) 35.0-35.9, adult: Secondary | ICD-10-CM

## 2020-05-30 DIAGNOSIS — Z01419 Encounter for gynecological examination (general) (routine) without abnormal findings: Secondary | ICD-10-CM

## 2020-06-02 DIAGNOSIS — Z1211 Encounter for screening for malignant neoplasm of colon: Secondary | ICD-10-CM

## 2020-06-02 HISTORY — DX: Encounter for screening for malignant neoplasm of colon: Z12.11

## 2020-06-18 ENCOUNTER — Other Ambulatory Visit: Payer: Self-pay

## 2020-06-18 ENCOUNTER — Ambulatory Visit
Admission: RE | Admit: 2020-06-18 | Discharge: 2020-06-18 | Disposition: A | Discharge status: Home / Self Care | Payer: BC Managed Care – PPO | Source: Ambulatory Visit | Attending: Obstetrics and Gynecology | Admitting: Obstetrics and Gynecology

## 2020-06-18 ENCOUNTER — Encounter: Payer: Self-pay | Admitting: Obstetrics and Gynecology

## 2020-06-18 DIAGNOSIS — Z1231 Encounter for screening mammogram for malignant neoplasm of breast: Secondary | ICD-10-CM

## 2020-06-19 DIAGNOSIS — Z1211 Encounter for screening for malignant neoplasm of colon: Secondary | ICD-10-CM | POA: Diagnosis not present

## 2020-06-20 LAB — COLOGUARD: Cologuard: NEGATIVE

## 2020-06-25 LAB — COLOGUARD: COLOGUARD: NEGATIVE

## 2020-06-25 LAB — EXTERNAL GENERIC LAB PROCEDURE: COLOGUARD: NEGATIVE

## 2020-06-26 ENCOUNTER — Encounter: Payer: Self-pay | Admitting: Obstetrics and Gynecology

## 2020-06-26 ENCOUNTER — Telehealth: Payer: Self-pay

## 2020-06-26 NOTE — Telephone Encounter (Signed)
Pt aware of NEG results. °

## 2020-10-31 DIAGNOSIS — H663X1 Other chronic suppurative otitis media, right ear: Secondary | ICD-10-CM | POA: Diagnosis not present

## 2020-10-31 DIAGNOSIS — H7201 Central perforation of tympanic membrane, right ear: Secondary | ICD-10-CM | POA: Diagnosis not present

## 2020-10-31 DIAGNOSIS — H6063 Unspecified chronic otitis externa, bilateral: Secondary | ICD-10-CM | POA: Diagnosis not present

## 2020-10-31 DIAGNOSIS — H6123 Impacted cerumen, bilateral: Secondary | ICD-10-CM | POA: Diagnosis not present

## 2020-10-31 DIAGNOSIS — H9210 Otorrhea, unspecified ear: Secondary | ICD-10-CM | POA: Diagnosis not present

## 2020-11-21 DIAGNOSIS — H7201 Central perforation of tympanic membrane, right ear: Secondary | ICD-10-CM | POA: Diagnosis not present

## 2020-11-21 DIAGNOSIS — H6061 Unspecified chronic otitis externa, right ear: Secondary | ICD-10-CM | POA: Diagnosis not present

## 2020-12-25 ENCOUNTER — Telehealth: Payer: Self-pay

## 2020-12-25 NOTE — Telephone Encounter (Signed)
Pt calling; is menopausal; started bleeding vaginally today.  [phone] Pt states about a week ago she had a little bit of spotting; nothing else until today; today it is like a light period.  Pt aware needs to be seen; will be called to scheduled as I am in [place] office an not able to tx call d/t different phone systems.

## 2020-12-25 NOTE — Telephone Encounter (Signed)
Pt is scheduled with [facility] on 12/1 at 2:10.

## 2021-01-02 ENCOUNTER — Other Ambulatory Visit: Payer: Self-pay

## 2021-01-02 ENCOUNTER — Encounter: Payer: Self-pay | Admitting: Obstetrics and Gynecology

## 2021-01-02 ENCOUNTER — Other Ambulatory Visit (HOSPITAL_COMMUNITY)
Admission: RE | Admit: 2021-01-02 | Discharge: 2021-01-02 | Disposition: A | Discharge status: Home / Self Care | Payer: BC Managed Care – PPO | Source: Ambulatory Visit | Attending: Obstetrics and Gynecology | Admitting: Obstetrics and Gynecology

## 2021-01-02 ENCOUNTER — Ambulatory Visit (INDEPENDENT_AMBULATORY_CARE_PROVIDER_SITE_OTHER): Payer: BC Managed Care – PPO | Admitting: Obstetrics and Gynecology

## 2021-01-02 VITALS — BP 126/80 | Ht 65.0 in | Wt 219.0 lb

## 2021-01-02 DIAGNOSIS — N950 Postmenopausal bleeding: Secondary | ICD-10-CM | POA: Diagnosis not present

## 2021-01-02 DIAGNOSIS — R399 Unspecified symptoms and signs involving the genitourinary system: Secondary | ICD-10-CM

## 2021-01-02 DIAGNOSIS — N8502 Endometrial intraepithelial neoplasia [EIN]: Secondary | ICD-10-CM | POA: Diagnosis not present

## 2021-01-02 DIAGNOSIS — Z124 Encounter for screening for malignant neoplasm of cervix: Secondary | ICD-10-CM

## 2021-01-02 DIAGNOSIS — N840 Polyp of corpus uteri: Secondary | ICD-10-CM | POA: Diagnosis not present

## 2021-01-02 LAB — POCT URINALYSIS DIPSTICK
Bilirubin, UA: NEGATIVE
Glucose, UA: NEGATIVE
Ketones, UA: NEGATIVE
Nitrite, UA: NEGATIVE
Protein, UA: NEGATIVE
Spec Grav, UA: 1.02 (ref 1.010–1.025)
pH, UA: 6 (ref 5.0–8.0)

## 2021-01-02 MED ORDER — NITROFURANTOIN MONOHYD MACRO 100 MG PO CAPS: 100.0000 mg | ORAL_CAPSULE | Freq: Two times a day (BID) | ORAL | 0 refills | Status: AC

## 2021-01-02 NOTE — Progress Notes (Signed)
Haley Barnes, Haley Evener, PA-C   Chief Complaint  Patient presents with   Vaginal Bleeding    Had 2 spotting episodes in Nov, no abnormal pain   Urinary Tract Infection    Urgency urinating, no burning x couple of days    HPI:      Ms. Haley Barnes is a 60 y.o. [id number] whose LMP was Patient's last menstrual period was 07/01/2015., presents today for PMB a couple wks ago. Notice a little spot on pad about 2 wks ago and then 2 days of red blood with wiping/small amount on poise pad. No cramping/pain. Had clear vag d/c before sx and nipple pain for a few days, resolved. Hx of PMB 2019 with GYN ultrasound and endometrial biopsy due to thickened EM=15.48mm; biopsy revealed inactive endometrium. She took progesterone to stop the prolonged bleeding with sx relief. Also S/p D&C and endometrial polypectomy in 2015 for AUB. Neg pap/neg HPV DNA 3/21 She is not sex active. No vag sx.  Also with urinary urgency/frequency for a few days. No hematuria/dysuria/LBP/pelvic pain, fevers. No vag sx. No hx of UTIs, no recent abx use.   Patient Active Problem List   Diagnosis Date Noted   PMB (postmenopausal bleeding) 01/02/2021   BMI 35.0-35.9,adult 05/30/2020   Abnormal uterine bleeding 11/09/2017   Colon cancer screening 04/19/2017   Left ear pain 10/30/2015   Paresthesia 07/15/2015   Neuropathy    Vitamin D deficiency 12/30/2009   OBESITY 12/30/2009   Vitamin B12 deficiency 11/07/2009   Subclinical hypothyroidism 11/04/2009   Anxiety state 11/04/2009   PALPITATIONS, OCCASIONAL 10/11/2009    Past Surgical History:  Procedure Laterality Date   BREAST BIOPSY Left 01/2011   benign/ Dr Bary Castilla   CESAREAN SECTION  1990   DILATION AND CURETTAGE OF UTERUS  10/19/2013   with hysteroscopy and polypectomy   HYSTEROSCOPY  2015   Dr Kenton Kingfisher   wisdom teeth removal      Family History  Problem Relation Age of Onset   Hypertension Mother    Thyroid disease Mother    Cancer Neg Hx         breast,colon,lung   Diabetes Neg Hx    Heart attack Neg Hx    Stroke Neg Hx     Social History   Socioeconomic History   Marital status: Married    Spouse name: Haley Barnes   Number of children: 3   Years of education: Not on file   Highest education level: Not on file  Occupational History   Occupation: Dealer  Tobacco Use   Smoking status: Never   Smokeless tobacco: Never  Vaping Use   Vaping Use: Never used  Substance and Sexual Activity   Alcohol use: No    Alcohol/week: 0.0 standard drinks   Drug use: No   Sexual activity: Yes    Partners: Male    Birth control/protection: None, Post-menopausal  Other Topics Concern   Not on file  Social History Narrative   Lives with husband Haley Barnes). Has  2 sons [phone]), and daughter.   Social Determinants of Health   Financial Resource Strain: Not on file  Food Insecurity: Not on file  Transportation Needs: Not on file  Physical Activity: Not on file  Stress: Not on file  Social Connections: Not on file  Intimate Partner Violence: Not on file    Outpatient Medications Prior to Visit  Medication Sig Dispense Refill   Cholecalciferol 25 MCG (1000 UT) tablet Take by mouth. (  Patient not taking: Reported on 05/30/2020)     vitamin B-12 (CYANOCOBALAMIN) 500 MCG tablet Take 500 mcg by mouth daily. (Patient not taking: Reported on 05/30/2020)     No facility-administered medications prior to visit.    ROS:  Review of Systems  Constitutional:  Negative for fever.  Gastrointestinal:  Negative for blood in stool, constipation, diarrhea, nausea and vomiting.  Genitourinary:  Positive for frequency, urgency and vaginal bleeding. Negative for dyspareunia, dysuria, flank pain, hematuria, menstrual problem, vaginal discharge and vaginal pain.  Musculoskeletal:  Negative for back pain.  Skin:  Negative for rash.  BREAST: No symptoms   OBJECTIVE:   Vitals:  BP 126/80   Ht 5\' 5"  (1.651 m)   Wt 219  lb (99.3 kg)   LMP 07/01/2015   BMI 36.44 kg/m   Physical Exam Vitals reviewed.  Constitutional:      Appearance: She is well-developed.  Pulmonary:     Effort: Pulmonary effort is normal.  Genitourinary:    General: Normal vulva.     Pubic Area: No rash.      Labia:        Right: No rash, tenderness or lesion.        Left: No rash, tenderness or lesion.      Vagina: Normal. No vaginal discharge, erythema, tenderness or bleeding.     Cervix: No cervical bleeding.     Uterus: Normal. Not enlarged and not tender.      Adnexa: Right adnexa normal and left adnexa normal.       Right: No mass or tenderness.         Left: No mass or tenderness.       Musculoskeletal:        General: Normal range of motion.     Cervical back: Normal range of motion.  Skin:    General: Skin is warm and dry.  Neurological:     General: No focal deficit present.     Mental Status: She is alert and oriented to person, place, and time.  Psychiatric:        Mood and Affect: Mood normal.        Behavior: Behavior normal.        Thought Content: Thought content normal.        Judgment: Judgment normal.   Endometrial Biopsy After discussion with the patient regarding her abnormal uterine bleeding I recommended that she proceed with an endometrial biopsy for further diagnosis. The risks, benefits, alternatives, and indications for an endometrial biopsy were discussed with the patient. Verbal consent was obtained.   PROCEDURE NOTE:  Pipelle endometrial biopsy was performed using aseptic technique with iodine preparation.  The uterus was sounded to a length of 10.0 cm.  Adequate sampling was obtained with minimal blood loss.  The patient tolerated the procedure well.  Disposition will be pending pathology.   Results: Results for orders placed or performed in visit on 01/02/21 (from the past 24 hour(s))  POCT Urinalysis Dipstick     Status: Abnormal   Collection Time: 01/02/21  2:38 PM  Result Value  Ref Range   Color, UA yellow    Clarity, UA clear    Glucose, UA Negative Negative   Bilirubin, UA neg    Ketones, UA neg    Spec Grav, UA 1.020 1.010 - 1.025   Blood, UA trace    pH, UA 6.0 5.0 - 8.0   Protein, UA Negative Negative   Urobilinogen, UA  Nitrite, UA neg    Leukocytes, UA Moderate (2+) (A) Negative   Appearance     Odor       Assessment/Plan: PMB (postmenopausal bleeding) - Plan: US PELVIS TRANSVAGINAL NON-OB (TV ONLY), Cytology - PAP, Surgical pathology; sx twice in past 3 wks. Neg exam today. Check GYN u/s, EMB today, pap. Will f/u with results. Hx of PMB in past with polypectomy.   Cervical cancer screening - Plan: Cytology - PAP; red area on cx, doesn't look suspicious; not friable.   UTI symptoms - Plan: nitrofurantoin, macrocrystal-monohydrate, (MACROBID) 100 MG capsule, Urine Culture, POCT Urinalysis Dipstick; pos sx and UA. Rx macrobid. Check C&S. F/u prn.     Meds ordered this encounter  Medications   nitrofurantoin, macrocrystal-monohydrate, (MACROBID) 100 MG capsule    Sig: Take 1 capsule (100 mg total) by mouth 2 (two) times daily for 5 days.    Dispense:  10 capsule    Refill:  0    Order Specific Question:   Supervising Provider    Answer:   Gae Dry [id number]       Return if symptoms worsen or fail to improve.  Haley Barnes B. Alverto Shedd, PA-C 01/02/2021 2:40 PM

## 2021-01-04 LAB — URINE CULTURE

## 2021-01-04 NOTE — Progress Notes (Signed)
Pls f/u with pt re: UTI sx. Abx may or may not work. If still sx, will change abx.

## 2021-01-06 LAB — SURGICAL PATHOLOGY

## 2021-01-06 MED ORDER — AMOXICILLIN 500 MG PO CAPS: 500.0000 mg | ORAL_CAPSULE | Freq: Two times a day (BID) | ORAL | 0 refills | Status: AC

## 2021-01-06 NOTE — Progress Notes (Signed)
Pt aware.

## 2021-01-06 NOTE — Progress Notes (Signed)
Pls let pt know the biopsy showed polyps (she has had them before) and can cause bleeding. We'll wait for the u/s results to develop a plan. Thx.

## 2021-01-06 NOTE — Progress Notes (Signed)
Pls notify pt I sent in addl abx to take for 7 days. F/u prn.

## 2021-01-06 NOTE — Addendum Note (Signed)
Addended by: Althea Grimmer B on: 01/06/2021 10:35 AM   Modules accepted: Orders

## 2021-01-08 ENCOUNTER — Encounter: Payer: Self-pay | Admitting: Obstetrics and Gynecology

## 2021-01-08 LAB — CYTOLOGY - PAP
Comment: NEGATIVE
Diagnosis: UNDETERMINED — AB
High risk HPV: NEGATIVE

## 2021-01-13 ENCOUNTER — Other Ambulatory Visit: Payer: BC Managed Care – PPO

## 2021-01-17 ENCOUNTER — Ambulatory Visit: Payer: BC Managed Care – PPO

## 2021-01-17 DIAGNOSIS — N950 Postmenopausal bleeding: Secondary | ICD-10-CM

## 2021-01-21 ENCOUNTER — Other Ambulatory Visit: Payer: Self-pay

## 2021-01-21 ENCOUNTER — Ambulatory Visit
Admission: RE | Admit: 2021-01-21 | Discharge: 2021-01-21 | Disposition: A | Discharge status: Home / Self Care | Payer: BC Managed Care – PPO | Source: Ambulatory Visit | Attending: Obstetrics and Gynecology | Admitting: Obstetrics and Gynecology

## 2021-01-21 DIAGNOSIS — N950 Postmenopausal bleeding: Secondary | ICD-10-CM | POA: Insufficient documentation

## 2021-01-22 DIAGNOSIS — N950 Postmenopausal bleeding: Secondary | ICD-10-CM | POA: Diagnosis not present

## 2021-01-22 DIAGNOSIS — N854 Malposition of uterus: Secondary | ICD-10-CM | POA: Diagnosis not present

## 2021-01-22 NOTE — Progress Notes (Signed)
Hysteroscopy D&C would be indicated if she is symptomatic (still bleeding).  Otherwise can wait/

## 2021-01-22 NOTE — Progress Notes (Signed)
Pls review GYN u/s results. EMB showed polyps, neg for malignancy. D&C?

## 2021-02-21 DIAGNOSIS — H722X1 Other marginal perforations of tympanic membrane, right ear: Secondary | ICD-10-CM | POA: Diagnosis not present

## 2021-06-03 ENCOUNTER — Other Ambulatory Visit: Payer: Self-pay | Admitting: Obstetrics and Gynecology

## 2021-07-19 NOTE — Progress Notes (Unsigned)
PCP: Rica Records, PA-C   No chief complaint on file.   HPI:      Haley Barnes is a 61 y.o. [id number] whose LMP was Patient's last menstrual period was 07/01/2015., presents today for her annual examination.  Her menses are absent due to menopause. No PMB. Hx of PMB 2019 with GYN ultrasound and endometrial biopsy due to thickened EM=15.49mm; biopsy revealed inactive endometrium. She took progesterone to stop the prolonged bleeding with sx relief. Also S/p D&C and endometrial polypectomy in 2015 for AUB. She does have tolerable vasomotor sx.  Had PMB 11//22 wih neg EMB??? Sx resolved   Sex activity: single partner, contraception - post menopausal status. She does not have vaginal dryness.  Last Pap: 04/11/19  Results were: no abnormalities /neg HPV DNA.   Last mammogram: 06/18/20  Results were: normal--routine follow-up in 12 months FH unknown.  The patient does not do self-breast exams.  Colonoscopy: never; had neg Cologuard 3/19 and 5/22; repeat due after 3 yrs.   Tobacco use: The patient denies current or previous tobacco use. Alcohol use: none  No drug use. Exercise: not active  She does get adequate calcium but not Vitamin D in her diet.  Normal labs 3/21  Has issues with bilat leg varicose veins. No swelling or pain. Hasn't tried compression socks yet. Would like info on vein clinics.   Past Medical History:  Diagnosis Date   Anxiety state, unspecified    DDD (degenerative disc disease)    Heart murmur    Neuropathy    Dr. Gerline Legacy   Obesity, unspecified    Other B-complex deficiencies    Palpitations    Occasional   Screening for colon cancer 06/2020   Neg Cologuard; repeat after 3 yrs   Solitary cyst of breast 2009   left   Torticollis    Unspecified disorder of thyroid    Unspecified vitamin D deficiency     Past Surgical History:  Procedure Laterality Date   BREAST BIOPSY Left 01/2011   benign/ Dr Lemar Livings   CESAREAN SECTION  1990    DILATION AND CURETTAGE OF UTERUS  10/19/2013   with hysteroscopy and polypectomy   HYSTEROSCOPY  2015   Dr Tiburcio Pea   wisdom teeth removal      Family History  Problem Relation Age of Onset   Hypertension Mother    Thyroid disease Mother    Cancer Neg Hx        breast,colon,lung   Diabetes Neg Hx    Heart attack Neg Hx    Stroke Neg Hx     Social History   Socioeconomic History   Marital status: Married    Spouse name: Onalee Hua   Number of children: 3   Years of education: Not on file   Highest education level: Not on file  Occupational History   Occupation: Garment/textile technologist  Tobacco Use   Smoking status: Never   Smokeless tobacco: Never  Vaping Use   Vaping Use: Never used  Substance and Sexual Activity   Alcohol use: No    Alcohol/week: 0.0 standard drinks of alcohol   Drug use: No   Sexual activity: Yes    Partners: Male    Birth control/protection: None, Post-menopausal  Other Topics Concern   Not on file  Social History Narrative   Lives with husband Onalee Hua). Has  2 sons [phone]), and daughter.   Social Determinants of Health   Financial Resource Strain: Not on file  Food Insecurity: Not on file  Transportation Needs: Not on file  Physical Activity: Inactive (03/08/2017)   Exercise Vital Sign    Days of Exercise per Week: 0 days    Minutes of Exercise per Session: 0 min  Stress: No Stress Concern Present (03/08/2017)   [employer] of Occupational Health - Occupational Stress Questionnaire    Feeling of Stress : Not at all  Social Connections: Moderately Integrated (03/08/2017)   Social Connection and Isolation Panel [NHANES]    Frequency of Communication with Friends and Family: More than three times a week    Frequency of Social Gatherings with Friends and Family: Once a week    Attends Religious Services: More than 4 times per year    Active Member of [employer] or Organizations: No    Attends Banker Meetings: Never     Marital Status: Married  Catering manager Violence: Not At Risk (03/08/2017)   Humiliation, Afraid, Rape, and Kick questionnaire    Fear of Current or Ex-Partner: No    Emotionally Abused: No    Physically Abused: No    Sexually Abused: No    No current outpatient medications on file.     ROS:  Review of Systems  Constitutional:  Negative for fatigue, fever and unexpected weight change.  Respiratory:  Negative for cough, shortness of breath and wheezing.   Cardiovascular:  Negative for chest pain, palpitations and leg swelling.  Gastrointestinal:  Negative for blood in stool, constipation, diarrhea, nausea and vomiting.  Endocrine: Negative for cold intolerance, heat intolerance and polyuria.  Genitourinary:  Negative for dyspareunia, dysuria, flank pain, frequency, genital sores, hematuria, menstrual problem, pelvic pain, urgency, vaginal bleeding, vaginal discharge and vaginal pain.  Musculoskeletal:  Negative for back pain, joint swelling and myalgias.  Skin:  Negative for rash.  Neurological:  Negative for dizziness, syncope, light-headedness, numbness and headaches.  Hematological:  Negative for adenopathy.  Psychiatric/Behavioral:  Negative for agitation, confusion, sleep disturbance and suicidal ideas. The patient is not nervous/anxious.   BREAST: No symptoms   Objective: LMP 07/01/2015    Physical Exam Constitutional:      Appearance: She is well-developed.  Genitourinary:     Vulva normal.     Right Labia: No rash, tenderness or lesions.    Left Labia: No tenderness, lesions or rash.    No vaginal discharge, erythema or tenderness.      Right Adnexa: not tender and no mass present.    Left Adnexa: not tender and no mass present.    No cervical friability or polyp.     Uterus is not enlarged or tender.  Breasts:    Right: No mass, nipple discharge, skin change or tenderness.     Left: No mass, nipple discharge, skin change or tenderness.  Neck:      Thyroid: No thyromegaly.  Cardiovascular:     Rate and Rhythm: Normal rate and regular rhythm.     Heart sounds: Normal heart sounds. No murmur heard. Pulmonary:     Effort: Pulmonary effort is normal.     Breath sounds: Normal breath sounds.  Abdominal:     Palpations: Abdomen is soft.     Tenderness: There is no abdominal tenderness. There is no guarding or rebound.  Musculoskeletal:        General: Normal range of motion.     Cervical back: Normal range of motion.  Lymphadenopathy:     Cervical: No cervical adenopathy.  Neurological:     General: No  focal deficit present.     Mental Status: She is alert and oriented to person, place, and time.     Cranial Nerves: No cranial nerve deficit.  Skin:    General: Skin is warm and dry.  Psychiatric:        Mood and Affect: Mood normal.        Behavior: Behavior normal.        Thought Content: Thought content normal.        Judgment: Judgment normal.  Vitals reviewed.    Assessment/Plan:  Encounter for annual routine gynecological examination  Encounter for screening mammogram for malignant neoplasm of breast - Plan: MM 3D SCREEN BREAST BILATERAL; pt to sched mammo  Screening for colon cancer - Plan: Cologuard; colonoscopy/cologuard discussed. Pt elects cologuard. Ref sent. Will f/u with results.  Blood tests for routine general physical examination - Plan: Comprehensive metabolic panel, Lipid panel, Hemoglobin A1c  Screening cholesterol level - Plan: Lipid panel  Screening for diabetes mellitus - Plan: Comprehensive metabolic panel, Hemoglobin A1c  BMI 35.0-35.9,adult - Plan: Comprehensive metabolic panel, Lipid panel, Hemoglobin A1c  Recommended Dr. Jimmie Molly at [facility] in [place] for varicose veins. Start compression socks.         GYN counsel breast self exam, mammography screening, menopause, adequate intake of calcium and vitamin D, diet and exercise    F/U  No follow-ups on file.  Modesto Ganoe B. Latesha Chesney,  PA-C 07/19/2021 12:35 PM

## 2021-07-22 ENCOUNTER — Ambulatory Visit (INDEPENDENT_AMBULATORY_CARE_PROVIDER_SITE_OTHER): Payer: BC Managed Care – PPO | Admitting: Obstetrics and Gynecology

## 2021-07-22 ENCOUNTER — Encounter: Payer: Self-pay | Admitting: Obstetrics and Gynecology

## 2021-07-22 VITALS — BP 126/70 | Ht 65.0 in | Wt 226.0 lb

## 2021-07-22 DIAGNOSIS — N950 Postmenopausal bleeding: Secondary | ICD-10-CM

## 2021-07-22 DIAGNOSIS — Z01419 Encounter for gynecological examination (general) (routine) without abnormal findings: Secondary | ICD-10-CM

## 2021-07-22 DIAGNOSIS — Z1231 Encounter for screening mammogram for malignant neoplasm of breast: Secondary | ICD-10-CM | POA: Diagnosis not present

## 2021-07-22 NOTE — Patient Instructions (Signed)
I value your feedback and you entrusting us with your care. If you get a [facility] patient survey, I would appreciate you taking the time to let us know about your experience today. Thank you!  [facility]: [phone]      

## 2021-08-14 ENCOUNTER — Encounter: Payer: Self-pay | Admitting: Obstetrics and Gynecology

## 2021-08-14 ENCOUNTER — Ambulatory Visit
Admission: RE | Admit: 2021-08-14 | Discharge: 2021-08-14 | Disposition: A | Discharge status: Home / Self Care | Payer: BC Managed Care – PPO | Source: Ambulatory Visit | Attending: Obstetrics and Gynecology | Admitting: Obstetrics and Gynecology

## 2021-08-14 DIAGNOSIS — Z1231 Encounter for screening mammogram for malignant neoplasm of breast: Secondary | ICD-10-CM | POA: Insufficient documentation

## 2021-09-01 DIAGNOSIS — H722X1 Other marginal perforations of tympanic membrane, right ear: Secondary | ICD-10-CM | POA: Diagnosis not present

## 2021-11-07 ENCOUNTER — Ambulatory Visit (INDEPENDENT_AMBULATORY_CARE_PROVIDER_SITE_OTHER): Payer: BC Managed Care – PPO

## 2021-11-07 ENCOUNTER — Ambulatory Visit (INDEPENDENT_AMBULATORY_CARE_PROVIDER_SITE_OTHER): Payer: BC Managed Care – PPO | Admitting: Podiatry

## 2021-11-07 DIAGNOSIS — M722 Plantar fascial fibromatosis: Secondary | ICD-10-CM | POA: Diagnosis not present

## 2021-11-07 MED ORDER — MELOXICAM 15 MG PO TABS: 15.0000 mg | ORAL_TABLET | Freq: Every day | ORAL | 1 refills | Status: DC

## 2021-11-07 MED ORDER — BETAMETHASONE SOD PHOS & ACET 6 (3-3) MG/ML IJ SUSP
3.0000 mg | Freq: Once | INTRAMUSCULAR | Status: AC
  Administered 2021-11-07: 3 mg via INTRA_ARTICULAR

## 2021-11-07 MED ORDER — METHYLPREDNISOLONE 4 MG PO TBPK: ORAL_TABLET | ORAL | 0 refills | Status: DC

## 2021-11-07 NOTE — Addendum Note (Signed)
Addended by: Edrick Kins on: 11/07/2021 10:51 AM   Modules accepted: Orders

## 2021-11-07 NOTE — Progress Notes (Signed)
   Chief Complaint  Patient presents with   Plantar Fasciitis    Patient is here for left foot plantar fasciitis.     Subjective: 61 y.o. female presenting today for evaluation of left heel pain that has been ongoing for few months now.  Denies a history of injury.  Gradual onset.  Patient has tried deep tissue massage, icing, golf ball massage.  She does wear good supportive shoes and sandals.  She does not wear sneakers.   Past Medical History:  Diagnosis Date   Anxiety state, unspecified    DDD (degenerative disc disease)    Heart murmur    Neuropathy    Dr. Greggory Keen   Obesity, unspecified    Other B-complex deficiencies    Palpitations    Occasional   Screening for colon cancer 06/2020   Neg Cologuard; repeat after 3 yrs   Solitary cyst of breast 2009   left   Torticollis    Unspecified disorder of thyroid    Unspecified vitamin D deficiency      Objective: Physical Exam General: The patient is alert and oriented x3 in no acute distress.  Dermatology: Skin is warm, dry and supple bilateral lower extremities. Negative for open lesions or macerations bilateral.   Vascular: Dorsalis Pedis and Posterior Tibial pulses palpable bilateral.  Capillary fill time is immediate to all digits.  Neurological: Epicritic and protective threshold intact bilateral.   Musculoskeletal: Tenderness to palpation to the plantar aspect of the left heel along the plantar fascia. All other joints range of motion within normal limits bilateral. Strength 5/5 in all groups bilateral.   Radiographic exam LT foot 11/07/2021: Normal osseous mineralization. Joint spaces preserved. No fracture/dislocation/boney destruction. No other soft tissue abnormalities or radiopaque foreign bodies.   Assessment: 1. Plantar fasciitis left foot  Plan of Care:  1. Patient evaluated. Xrays reviewed.   2. Injection of 0.5cc Celestone soluspan injected into the left plantar fascia.  3. Rx for Medrol Dose  Pak placed 4. Rx for Meloxicam ordered for patient. 5. Plantar fascial band(s) dispensed  6. Instructed patient regarding therapies and modalities at home to alleviate symptoms.  7. Return to clinic in 4 weeks.    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 Edrick Kins, DPM    [address], [place] [address]                Office [phone]  Fax [phone]

## 2021-12-09 ENCOUNTER — Ambulatory Visit (INDEPENDENT_AMBULATORY_CARE_PROVIDER_SITE_OTHER): Payer: BC Managed Care – PPO | Admitting: Podiatry

## 2021-12-09 DIAGNOSIS — M722 Plantar fascial fibromatosis: Secondary | ICD-10-CM | POA: Diagnosis not present

## 2021-12-09 NOTE — Progress Notes (Signed)
   Chief Complaint  Patient presents with   Follow-up    Patient is here for follow-up for left foot planar fasciitis.    Subjective: 61 y.o. female presenting today for follow-up evaluation of left heel pain that has been ongoing for few months now.  Patient states that overall there is significant improvement.  She only has minimal tenderness to the heel.  Overall she is very satisfied and has noticed a significant reduction in pain over the past month  Past Medical History:  Diagnosis Date   Anxiety state, unspecified    DDD (degenerative disc disease)    Heart murmur    Neuropathy    Dr. Greggory Keen   Obesity, unspecified    Other B-complex deficiencies    Palpitations    Occasional   Screening for colon cancer 06/2020   Neg Cologuard; repeat after 3 yrs   Solitary cyst of breast 2009   left   Torticollis    Unspecified disorder of thyroid    Unspecified vitamin D deficiency      Objective: Physical Exam General: The patient is alert and oriented x3 in no acute distress.  Dermatology: Skin is warm, dry and supple bilateral lower extremities. Negative for open lesions or macerations bilateral.   Vascular: Dorsalis Pedis and Posterior Tibial pulses palpable bilateral.  Capillary fill time is immediate to all digits.  Neurological: Epicritic and protective threshold intact bilateral.   Musculoskeletal: Today there is minimal tenderness to palpation to the plantar aspect of the left heel along the plantar fascia. All other joints range of motion within normal limits bilateral. Strength 5/5 in all groups bilateral.   Radiographic exam LT foot 11/07/2021: Normal osseous mineralization. Joint spaces preserved. No fracture/dislocation/boney destruction. No other soft tissue abnormalities or radiopaque foreign bodies.   Assessment: 1. Plantar fasciitis left foot  Plan of Care:  1. Patient evaluated. Xrays reviewed.   2.  Patient states that the plantar fascial brace  aggravated her foot.  Discontinue 3.  Continue meloxicam 15 mg daily as needed 4.  Continue wearing good supportive shoes and sneakers.  Patient states that she wears [employer] at work.  5.  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 Edrick Kins, DPM    [address], [place] [address]                Office [phone]  Fax [phone]

## 2021-12-11 DIAGNOSIS — M47816 Spondylosis without myelopathy or radiculopathy, lumbar region: Secondary | ICD-10-CM | POA: Diagnosis not present

## 2021-12-11 DIAGNOSIS — M545 Low back pain, unspecified: Secondary | ICD-10-CM | POA: Diagnosis not present

## 2021-12-17 DIAGNOSIS — M25512 Pain in left shoulder: Secondary | ICD-10-CM | POA: Diagnosis not present

## 2021-12-17 DIAGNOSIS — M542 Cervicalgia: Secondary | ICD-10-CM | POA: Diagnosis not present

## 2021-12-17 DIAGNOSIS — M5412 Radiculopathy, cervical region: Secondary | ICD-10-CM | POA: Diagnosis not present

## 2022-03-02 ENCOUNTER — Other Ambulatory Visit: Payer: Self-pay | Admitting: Obstetrics and Gynecology

## 2022-03-02 DIAGNOSIS — Z1231 Encounter for screening mammogram for malignant neoplasm of breast: Secondary | ICD-10-CM

## 2022-03-10 ENCOUNTER — Ambulatory Visit: Payer: Managed Care, Other (non HMO)

## 2022-03-10 ENCOUNTER — Ambulatory Visit: Payer: BC Managed Care – PPO | Admitting: Nurse Practitioner

## 2022-06-23 ENCOUNTER — Ambulatory Visit: Payer: BC Managed Care – PPO | Admitting: Family Medicine

## 2022-08-19 ENCOUNTER — Ambulatory Visit
Admission: RE | Admit: 2022-08-19 | Discharge: 2022-08-19 | Disposition: A | Discharge status: Home / Self Care | Payer: Managed Care, Other (non HMO) | Source: Ambulatory Visit | Attending: Obstetrics and Gynecology | Admitting: Obstetrics and Gynecology

## 2022-08-19 DIAGNOSIS — Z1231 Encounter for screening mammogram for malignant neoplasm of breast: Secondary | ICD-10-CM | POA: Insufficient documentation

## 2022-08-24 ENCOUNTER — Ambulatory Visit: Payer: Managed Care, Other (non HMO) | Admitting: Family Medicine

## 2022-08-24 ENCOUNTER — Encounter: Payer: Self-pay | Admitting: Family Medicine

## 2022-08-24 VITALS — BP 124/88 | HR 83 | Temp 98.0°F | Ht 65.0 in | Wt 230.0 lb

## 2022-08-24 DIAGNOSIS — Z6838 Body mass index (BMI) 38.0-38.9, adult: Secondary | ICD-10-CM

## 2022-08-24 DIAGNOSIS — E538 Deficiency of other specified B group vitamins: Secondary | ICD-10-CM

## 2022-08-24 DIAGNOSIS — E559 Vitamin D deficiency, unspecified: Secondary | ICD-10-CM

## 2022-08-24 DIAGNOSIS — I83893 Varicose veins of bilateral lower extremities with other complications: Secondary | ICD-10-CM

## 2022-08-24 DIAGNOSIS — Z23 Encounter for immunization: Secondary | ICD-10-CM

## 2022-08-24 DIAGNOSIS — H7291 Unspecified perforation of tympanic membrane, right ear: Secondary | ICD-10-CM

## 2022-08-24 DIAGNOSIS — E669 Obesity, unspecified: Secondary | ICD-10-CM | POA: Diagnosis not present

## 2022-08-24 DIAGNOSIS — Z114 Encounter for screening for human immunodeficiency virus [HIV]: Secondary | ICD-10-CM

## 2022-08-24 DIAGNOSIS — Z1159 Encounter for screening for other viral diseases: Secondary | ICD-10-CM

## 2022-08-24 NOTE — Progress Notes (Signed)
SUBJECTIVE:   Chief Complaint  Patient presents with   Establish Care   HPI Presents to clinic to establish care  No acute concerns  Not currently on prescribed medications.  Has not had PCP in years.  Followed with OBGYN.  Recently seen cardiology for cardiac murmur Asymptomatic.  Had ECHO 05/24, LVEF >55% no valvular stenosis, mild tricuspid insuffiencey, trace mitral insuffiencey. Conservative management recommended.    Lower extremity edea Chronic issue.  History of varicose veins, obesity class 2.  Denies any chest pain, shortness of breath, increased heart rate,  Recently seen by cardiology who recommended compression stockings and decease sodium intake.   She would like vascular referral for evaluation of varicosities  Cologuard 2022  negative Mammogram and PAP - up to date.  Follows with OBGYN.    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  Constitutional:      General: She is not in acute distress.    Appearance: She is not ill-appearing.  HENT:     Head: Normocephalic.     Right Ear: Ear canal and external ear normal. Right ear decreased TM mobility: ruptured TM.     Left Ear: Tympanic membrane, ear canal and external ear normal.     Nose: Nose normal.     Mouth/Throat:     Mouth: Mucous membranes are moist.  Eyes:     Extraocular Movements: Extraocular movements intact.     Conjunctiva/sclera: Conjunctivae normal.     Pupils: Pupils are equal, round, and reactive to light.  Neck:     Thyroid: No thyromegaly or thyroid tenderness.     Vascular: No carotid bruit.   Cardiovascular:     Rate and Rhythm: Normal rate and regular rhythm.     Pulses: Normal pulses.     Heart sounds: Normal heart sounds. No murmur heard. Pulmonary:     Effort: Pulmonary effort is normal.     Breath sounds: Normal breath sounds.  Abdominal:     General: Bowel sounds are normal. There is no distension.     Palpations: Abdomen is soft.     Tenderness: There is no abdominal tenderness. There is no right CVA tenderness, left CVA tenderness, guarding or rebound.  Musculoskeletal:        General: Normal range of motion.     Cervical back: Normal range of motion.     Right lower leg: No edema.     Left lower leg: No edema.  Lymphadenopathy:     Cervical: No cervical adenopathy.  Skin:    Capillary Refill: Capillary refill takes less than 2 seconds.  Neurological:     General: No focal deficit present.     Mental Status: She is alert and oriented to person, place, and time. Mental status is at baseline.     Motor: No weakness.  Psychiatric:        Mood and Affect: Mood normal.        Behavior: Behavior normal.        Thought Content: Thought content normal.  Judgment: Judgment normal.        08/24/2022    9:32 AM  Depression screen PHQ 2/9  Decreased Interest 0  Down, Depressed, Hopeless 0  PHQ - 2 Score 0      08/24/2022    9:32 AM  GAD 7 : Generalized Anxiety Score  Nervous, Anxious, on Edge 0  Control/stop worrying 0  Worry too much - different things 0  Trouble relaxing 0  Restless 0  Easily annoyed or irritable 0  Afraid - awful might happen 0  Total GAD 7 Score 0    ASSESSMENT/PLAN:  Varicose veins of both legs with edema -     Ambulatory referral to Vascular Surgery  Encounter for screening for HIV -     HIV Antibody (routine testing w rflx); Future  Need for hepatitis C screening test -     Hepatitis C antibody; Future  Vitamin D deficiency Assessment & Plan: Check Vitamin D level  Orders: -     VITAMIN D 25 Hydroxy (Vit-D  Deficiency, Fractures); Future  Vitamin B 12 deficiency Assessment & Plan: Check Vitamin B 12 level  Orders: -     Vitamin B12; Future  Obesity (BMI 30-39.9) -     CBC; Future -     Comprehensive metabolic panel; Future -     Hemoglobin A1c; Future -     Lipid panel; Future -     TSH; Future  Need for vaccination -     Varicella-zoster vaccine IM  Ruptured tympanic membrane, right Assessment & Plan: Chronic Follows with ENT    PDMP reviewed  Return in about 2 months (around 10/25/2022), or if symptoms worsen or fail to improve, for RN clinic.  Dana Allan, MD

## 2022-08-24 NOTE — Patient Instructions (Addendum)
It was a pleasure meeting you today. Thank you for allowing me to take part in your health care.  Our goals for today as we discussed include:  First shingles vaccine today. Schedule RN visit for second dose in 2 months  We will get some labs today.  If they are abnormal or we need to do something about them, I will call you.  If they are normal, I will send you a message on MyChart (if it is active) or a letter in the mail.  If you don't hear from [place] in 2 weeks, please call the office at the number below.   Will review chart  Recommend compression stockings and weight loss for lower extremity swelling. Can refer to vascular surgery in future.     If you have any questions or concerns, please do not hesitate to call the office at [phone].  I look forward to our next visit and until then take care and stay safe.  Regards,   Dana Allan, MD   [facility]

## 2022-08-25 ENCOUNTER — Other Ambulatory Visit (INDEPENDENT_AMBULATORY_CARE_PROVIDER_SITE_OTHER): Payer: Managed Care, Other (non HMO)

## 2022-08-25 DIAGNOSIS — E538 Deficiency of other specified B group vitamins: Secondary | ICD-10-CM | POA: Diagnosis not present

## 2022-08-25 DIAGNOSIS — Z1159 Encounter for screening for other viral diseases: Secondary | ICD-10-CM

## 2022-08-25 DIAGNOSIS — E669 Obesity, unspecified: Secondary | ICD-10-CM

## 2022-08-25 DIAGNOSIS — Z114 Encounter for screening for human immunodeficiency virus [HIV]: Secondary | ICD-10-CM

## 2022-08-25 DIAGNOSIS — E559 Vitamin D deficiency, unspecified: Secondary | ICD-10-CM | POA: Diagnosis not present

## 2022-08-25 LAB — COMPREHENSIVE METABOLIC PANEL
ALT: 10 U/L (ref 0–35)
AST: 13 U/L (ref 0–37)
Albumin: 4.5 g/dL (ref 3.5–5.2)
Alkaline Phosphatase: 86 U/L (ref 39–117)
BUN: 9 mg/dL (ref 6–23)
CO2: 28 mEq/L (ref 19–32)
Calcium: 9.5 mg/dL (ref 8.4–10.5)
Chloride: 102 mEq/L (ref 96–112)
Creatinine, Ser: 0.71 mg/dL (ref 0.40–1.20)
GFR: 92.61 mL/min (ref >60.00)
Glucose, Bld: 88 mg/dL (ref 70–99)
Potassium: 4.2 mEq/L (ref 3.5–5.1)
Sodium: 139 mEq/L (ref 135–145)
Total Bilirubin: 0.9 mg/dL (ref 0.2–1.2)
Total Protein: 7.1 g/dL (ref 6.0–8.3)

## 2022-08-25 LAB — VITAMIN D 25 HYDROXY (VIT D DEFICIENCY, FRACTURES): VITD: 19.87 ng/mL — ABNORMAL LOW (ref 30.00–100.00)

## 2022-08-25 LAB — HEMOGLOBIN A1C: Hgb A1c MFr Bld: 5.6 % (ref 4.6–6.5)

## 2022-08-25 LAB — LIPID PANEL
Cholesterol: 191 mg/dL (ref 0–200)
HDL: 51.7 mg/dL (ref >39.00)
LDL Cholesterol: 121 mg/dL — ABNORMAL HIGH (ref 0–99)
NonHDL: 138.93
Total CHOL/HDL Ratio: 4
Triglycerides: 91 mg/dL (ref 0.0–149.0)
VLDL: 18.2 mg/dL (ref 0.0–40.0)

## 2022-08-25 LAB — VITAMIN B12: Vitamin B-12: 213 pg/mL (ref 211–911)

## 2022-08-25 LAB — TSH: TSH: 3.38 u[IU]/mL (ref 0.35–5.50)

## 2022-08-26 ENCOUNTER — Other Ambulatory Visit: Payer: Self-pay | Admitting: Family Medicine

## 2022-08-26 LAB — HIV ANTIBODY (ROUTINE TESTING W REFLEX): HIV 1&2 Ab, 4th Generation: NONREACTIVE

## 2022-08-26 LAB — HEPATITIS C ANTIBODY: Hepatitis C Ab: NONREACTIVE

## 2022-08-27 ENCOUNTER — Other Ambulatory Visit: Payer: Self-pay | Admitting: Family Medicine

## 2022-08-27 DIAGNOSIS — E559 Vitamin D deficiency, unspecified: Secondary | ICD-10-CM

## 2022-08-27 MED ORDER — VITAMIN D (ERGOCALCIFEROL) 1.25 MG (50000 UNIT) PO CAPS
50000.0000 [IU] | ORAL_CAPSULE | ORAL | 1 refills | Status: DC
Start: 2022-08-27 — End: 2023-02-26

## 2022-09-05 ENCOUNTER — Encounter: Payer: Self-pay | Admitting: Family Medicine

## 2022-09-05 DIAGNOSIS — H7291 Unspecified perforation of tympanic membrane, right ear: Secondary | ICD-10-CM | POA: Insufficient documentation

## 2022-09-05 DIAGNOSIS — I83893 Varicose veins of bilateral lower extremities with other complications: Secondary | ICD-10-CM | POA: Insufficient documentation

## 2022-09-05 DIAGNOSIS — Z114 Encounter for screening for human immunodeficiency virus [HIV]: Secondary | ICD-10-CM | POA: Insufficient documentation

## 2022-09-05 DIAGNOSIS — Z23 Encounter for immunization: Secondary | ICD-10-CM | POA: Insufficient documentation

## 2022-09-05 DIAGNOSIS — Z1159 Encounter for screening for other viral diseases: Secondary | ICD-10-CM | POA: Insufficient documentation

## 2022-09-05 NOTE — Assessment & Plan Note (Signed)
Check Vitamin D level 

## 2022-09-05 NOTE — Assessment & Plan Note (Signed)
Chronic Follows with ENT

## 2022-09-05 NOTE — Assessment & Plan Note (Signed)
Chronic Check labs Encouraged portion control, healthy diet and increasing activity

## 2022-09-05 NOTE — Assessment & Plan Note (Signed)
Check Vitamin B 12 level 

## 2022-09-17 ENCOUNTER — Encounter (INDEPENDENT_AMBULATORY_CARE_PROVIDER_SITE_OTHER): Payer: Self-pay

## 2022-10-26 ENCOUNTER — Ambulatory Visit (INDEPENDENT_AMBULATORY_CARE_PROVIDER_SITE_OTHER): Payer: Managed Care, Other (non HMO)

## 2022-10-26 DIAGNOSIS — Z23 Encounter for immunization: Secondary | ICD-10-CM

## 2022-10-26 NOTE — Progress Notes (Signed)
Pt presented to have 2nd shingles vaccine. Pt was identified through two identifiers. Pt tolerated injection well in the right  deltoid

## 2022-11-05 ENCOUNTER — Telehealth: Payer: Self-pay | Admitting: Family Medicine

## 2022-11-05 NOTE — Telephone Encounter (Signed)
Patient called and said she has been having real bad anxiety since last night. She wanted to know if she could get something for it. The pharmacy she uses is [facility] [id number] [facility], [place] - [address] [place], [place] [address] Phone: [phone]  Fax: [phone]  Her number is [phone].

## 2022-11-05 NOTE — Telephone Encounter (Signed)
Scheduled pt a virtual appointment with Dr. Clent Ridges 11/06/2022

## 2022-11-06 ENCOUNTER — Telehealth: Payer: Managed Care, Other (non HMO) | Admitting: Family Medicine

## 2022-11-06 ENCOUNTER — Encounter: Payer: Self-pay | Admitting: Family Medicine

## 2022-11-06 VITALS — Ht 65.0 in | Wt 230.0 lb

## 2022-11-06 DIAGNOSIS — F39 Unspecified mood [affective] disorder: Secondary | ICD-10-CM | POA: Diagnosis not present

## 2022-11-06 DIAGNOSIS — E785 Hyperlipidemia, unspecified: Secondary | ICD-10-CM

## 2022-11-06 DIAGNOSIS — E559 Vitamin D deficiency, unspecified: Secondary | ICD-10-CM | POA: Diagnosis not present

## 2022-11-06 DIAGNOSIS — H6090 Unspecified otitis externa, unspecified ear: Secondary | ICD-10-CM | POA: Diagnosis not present

## 2022-11-06 NOTE — Progress Notes (Signed)
Virtual Visit via Video note  I connected with Haley Barnes on 11/14/22 at 1200 by video and verified that I am speaking with the correct person using two identifiers. Haley Barnes is currently located at home and  is currently alone during visit. The provider, Dana Allan, MD is located in their office at time of visit.  I discussed the limitations, risks, security and privacy concerns of performing an evaluation and management service by video and the availability of in person appointments. I also discussed with the patient that there may be a patient responsible charge related to this service. The patient expressed understanding and agreed to proceed.  Subjective: PCP: Dana Allan, MD  Chief Complaint  Patient presents with   Anxiety    HPI  Presents for acute visit to discuss concern for anxiety.  Discussed the use of AI scribe software for clinical note transcription with the patient, who gave verbal consent to proceed.  History of Present Illness The patient, born on 19-Dec-1960, presented with a recent episode of severe anxiety. This episode occurred on a Wednesday night after the patient had gone to bed. The patient described feeling "super anxious," which was unusual for them. The patient had recently started taking ear drops (Ciprofloxacin with Dexamethasone) for an ear infection and initially suspected that the medication might be causing the anxiety. However, after consulting with the prescribing ENT doctor, it was determined that the medication was unlikely to be the cause.  The patient reported feeling less anxious the following morning after deciding not to use the ear drops. However, they also noted that they had not slept well the previous night. The patient speculated that the anxiety might be related to the current political climate, as they had been closely following the election and listening to political talk radio. The patient described themselves as typically calm  and not easily affected by external factors.  The patient reported no other symptoms during the episode of anxiety, such as shortness of breath, chest pain, nausea, or vomiting. They did note feeling thirsty and found that sitting up in a recliner helped alleviate the anxiety. The patient denied having any pain and reported no changes in their diet. They also denied having any previous episodes of anxiety or having been treated for anxiety in the past.  The patient's recent blood work showed slightly elevated bad cholesterol and a slightly elevated A1C, just under the prediabetic range. Their thyroid function was normal. The patient had been prescribed Vitamin D weekly for a deficiency and had a few more capsules left to take. The patient reported feeling anxious again on the morning of the consultation, but attributed this to nervousness about the virtual consultation, which was a new experience for them.        11/06/2022   11:34 AM 08/24/2022    9:32 AM  GAD 7 : Generalized Anxiety Score  Nervous, Anxious, on Edge 1 0  Control/stop worrying 2 0  Worry too much - different things 0 0  Trouble relaxing 0 0  Restless 0 0  Easily annoyed or irritable 0 0  Afraid - awful might happen 0 0  Total GAD 7 Score 3 0  Anxiety Difficulty Not difficult at all         11/06/2022   11:32 AM 08/24/2022    9:32 AM  Depression screen PHQ 2/9  Decreased Interest 0 0  Down, Depressed, Hopeless 0 0  PHQ - 2 Score 0 0  Altered sleeping 0   Tired, decreased energy 1   Change in appetite 0   Feeling bad or failure about yourself  0   Trouble concentrating 0   Moving slowly or fidgety/restless 0   Suicidal thoughts 0   PHQ-9 Score 1   Difficult doing work/chores Not difficult at all     ROS: Per HPI  Current Outpatient Medications:    Vitamin D, Ergocalciferol, (DRISDOL) 1.25 MG (50000 UNIT) CAPS capsule, Take 1 capsule (50,000 Units total) by mouth every 7 (seven) days., Disp: 12 capsule, Rfl:  1  Observations/Objective: Physical Exam Pulmonary:     Effort: Pulmonary effort is normal.  Neurological:     Mental Status: She is alert and oriented to person, place, and time. Mental status is at baseline.  Psychiatric:        Mood and Affect: Mood normal.        Behavior: Behavior normal.        Thought Content: Thought content normal.        Judgment: Judgment normal.    Assessment and Plan: Mood disorder (HCC) Assessment & Plan: Recent onset of anxiety symptoms, possibly related to external stressors (political climate, end times concerns) and/or recent use of Ciprodex ear drops for an ear infection. No other associated symptoms such as shortness of breath, chest pain, nausea, or vomiting. No history of anxiety or mental health treatment. -Continue monitoring symptoms. -Consider stress management techniques and mindfulness stress relief program. -Consider therapy if symptoms persist or worsen. -If symptoms become severe or associated with other concerning symptoms, return for further evaluation.   Otitis externa, unspecified chronicity, unspecified laterality, unspecified type Assessment & Plan: Treated with Ciprodex ear drops. No adverse reactions reported since resuming medication. -Continue Ciprodex as prescribed. -Follow up with ENT for continued management   Vitamin D deficiency Assessment & Plan: On weekly Vitamin D supplementation. -Continue Vitamin D supplementation as prescribed.   Hyperlipidemia, unspecified hyperlipidemia type Assessment & Plan: Slightly elevated LDL cholesterol on last labs. -Monitor diet and exercise.    Follow Up Instructions: Return if symptoms worsen or fail to improve, for PCP.   I discussed the assessment and treatment plan with the patient. The patient was provided an opportunity to ask questions and all were answered. The patient agreed with the plan and demonstrated an understanding of the instructions.   The patient was  advised to call back or seek an in-person evaluation if the symptoms worsen or if the condition fails to improve as anticipated.  The above assessment and management plan was discussed with the patient. The patient verbalized understanding of and has agreed to the management plan. Patient is aware to call the clinic if symptoms persist or worsen. Patient is aware when to return to the clinic for a follow-up visit. Patient educated on when it is appropriate to go to the emergency department.    Dana Allan, MD

## 2022-11-06 NOTE — Patient Instructions (Addendum)
It was a pleasure meeting you today. Thank you for allowing me to take part in your health care.  Our goals for today as we discussed include:  Glad you are feeling better  Can continue to reach out to you social network and church   [email or web address] [address] [place] [phone]   [facility]  [address]  [place] [address] [phone]     Less likely are symptoms from ear drops.    If symptoms worsen please notify MD  If you have any questions or concerns, please do not hesitate to call the office at [phone].  I look forward to our next visit and until then take care and stay safe.  Regards,   Dana Allan, MD   [facility]

## 2022-11-14 ENCOUNTER — Encounter: Payer: Self-pay | Admitting: Family Medicine

## 2022-11-14 DIAGNOSIS — E785 Hyperlipidemia, unspecified: Secondary | ICD-10-CM | POA: Insufficient documentation

## 2022-11-14 DIAGNOSIS — F39 Unspecified mood [affective] disorder: Secondary | ICD-10-CM | POA: Insufficient documentation

## 2022-11-14 DIAGNOSIS — H6090 Unspecified otitis externa, unspecified ear: Secondary | ICD-10-CM | POA: Insufficient documentation

## 2022-11-14 NOTE — Assessment & Plan Note (Signed)
Treated with Ciprodex ear drops. No adverse reactions reported since resuming medication. -Continue Ciprodex as prescribed. -Follow up with ENT for continued management

## 2022-11-14 NOTE — Assessment & Plan Note (Signed)
On weekly Vitamin D supplementation. -Continue Vitamin D supplementation as prescribed.

## 2022-11-14 NOTE — Assessment & Plan Note (Signed)
Slightly elevated LDL cholesterol on last labs. -Monitor diet and exercise.

## 2022-11-14 NOTE — Assessment & Plan Note (Signed)
Recent onset of anxiety symptoms, possibly related to external stressors (political climate, end times concerns) and/or recent use of Ciprodex ear drops for an ear infection. No other associated symptoms such as shortness of breath, chest pain, nausea, or vomiting. No history of anxiety or mental health treatment. -Continue monitoring symptoms. -Consider stress management techniques and mindfulness stress relief program. -Consider therapy if symptoms persist or worsen. -If symptoms become severe or associated with other concerning symptoms, return for further evaluation.

## 2022-11-19 ENCOUNTER — Ambulatory Visit: Payer: Managed Care, Other (non HMO) | Admitting: Nurse Practitioner

## 2022-11-25 ENCOUNTER — Encounter: Payer: Self-pay | Admitting: Family Medicine

## 2022-11-26 ENCOUNTER — Other Ambulatory Visit (INDEPENDENT_AMBULATORY_CARE_PROVIDER_SITE_OTHER): Payer: Self-pay | Admitting: Nurse Practitioner

## 2022-11-26 DIAGNOSIS — I83893 Varicose veins of bilateral lower extremities with other complications: Secondary | ICD-10-CM

## 2022-12-02 ENCOUNTER — Encounter (INDEPENDENT_AMBULATORY_CARE_PROVIDER_SITE_OTHER): Payer: Self-pay | Admitting: Nurse Practitioner

## 2022-12-02 ENCOUNTER — Ambulatory Visit (INDEPENDENT_AMBULATORY_CARE_PROVIDER_SITE_OTHER): Payer: Managed Care, Other (non HMO)

## 2022-12-02 ENCOUNTER — Ambulatory Visit (INDEPENDENT_AMBULATORY_CARE_PROVIDER_SITE_OTHER): Payer: Managed Care, Other (non HMO) | Admitting: Nurse Practitioner

## 2022-12-02 VITALS — BP 134/78 | HR 76 | Resp 16 | Wt 236.0 lb

## 2022-12-02 DIAGNOSIS — I83813 Varicose veins of bilateral lower extremities with pain: Secondary | ICD-10-CM | POA: Diagnosis not present

## 2022-12-02 DIAGNOSIS — E785 Hyperlipidemia, unspecified: Secondary | ICD-10-CM

## 2022-12-02 DIAGNOSIS — I83893 Varicose veins of bilateral lower extremities with other complications: Secondary | ICD-10-CM

## 2022-12-02 NOTE — Progress Notes (Unsigned)
Subjective:    Patient ID: Haley Barnes, female    DOB: [birth year], 62 y.o.   MRN: [id number] Chief Complaint  Patient presents with   New Patient (Initial Visit)    Ref Clent Ridges consult ble edema with varicosities     The patient is seen for evaluation of symptomatic varicose veins. The patient relates burning and stinging which worsened steadily throughout the course of the day, particularly with standing. The patient also notes an aching and throbbing pain over the varicosities, particularly with prolonged dependent positions. The symptoms are significantly improved with elevation.  The patient also notes that during hot weather the symptoms are greatly intensified. The patient states the pain from the varicose veins interferes with work, daily exercise, shopping and household maintenance. At this point, the symptoms are persistent and severe enough that they're having a negative impact on lifestyle and are interfering with daily activities.  There is no history of DVT, PE or superficial thrombophlebitis. There is no history of ulceration or hemorrhage. The patient denies a significant family history of varicose veins.  The patient has worn graduated compression in the past. At the present time the patient hasbeen using over-the-counter analgesics. There is no history of prior surgical intervention or sclerotherapy. Today noninvasive studies show no evidence of DVT or superficial phlebitis bilaterally.  Patient has some deep venous insufficiency bilaterally.  She has extensive superficial venous reflux bilaterally as well.      Review of Systems  Cardiovascular:  Positive for leg swelling.  All other systems reviewed and are negative.      Objective:   Physical Exam Vitals reviewed.  HENT:     Head: Normocephalic.  Cardiovascular:     Rate and Rhythm: Normal rate.     Pulses: Normal pulses.  Pulmonary:     Effort: Pulmonary effort is normal.  Musculoskeletal:        General:  Tenderness present.  Skin:    General: Skin is warm and dry.  Neurological:     Mental Status: She is alert and oriented to person, place, and time.  Psychiatric:        Mood and Affect: Mood normal.        Behavior: Behavior normal.     BP 134/78 (BP Location: Left Arm)   Pulse 76   Resp 16   Wt 236 lb (107 kg)   LMP 07/01/2015   BMI 39.27 kg/m   Past Medical History:  Diagnosis Date   Anxiety state, unspecified    DDD (degenerative disc disease)    Heart murmur    Neuropathy    Dr. Gerline Legacy   Obesity, unspecified    Other B-complex deficiencies    Palpitations    Occasional   Screening for colon cancer 06/2020   Neg Cologuard; repeat after 3 yrs   Solitary cyst of breast 2009   left   Torticollis    Unspecified disorder of thyroid    Unspecified vitamin D deficiency     Social History   Socioeconomic History   Marital status: Married    Spouse name: Onalee Hua   Number of children: 3   Years of education: Not on file   Highest education level: Not on file  Occupational History   Occupation: Garment/textile technologist  Tobacco Use   Smoking status: Never   Smokeless tobacco: Never  Vaping Use   Vaping status: Never Used  Substance and Sexual Activity   Alcohol use: No    Alcohol/week: 0.0  standard drinks of alcohol   Drug use: No   Sexual activity: Not Currently    Partners: Male    Birth control/protection: Post-menopausal  Other Topics Concern   Not on file  Social History Narrative   Lives with husband Onalee Hua). Has  2 sons [phone]), and daughter.   Social Determinants of Health   Financial Resource Strain: Not on file  Food Insecurity: Not on file  Transportation Needs: Not on file  Physical Activity: Inactive (03/08/2017)   Exercise Vital Sign    Days of Exercise per Week: 0 days    Minutes of Exercise per Session: 0 min  Stress: No Stress Concern Present (03/08/2017)   [employer] of Occupational Health -  Occupational Stress Questionnaire    Feeling of Stress : Not at all  Social Connections: Moderately Integrated (03/08/2017)   Social Connection and Isolation Panel [NHANES]    Frequency of Communication with Friends and Family: More than three times a week    Frequency of Social Gatherings with Friends and Family: Once a week    Attends Religious Services: More than 4 times per year    Active Member of [employer] or Organizations: No    Attends Banker Meetings: Never    Marital Status: Married  Catering manager Violence: Not At Risk (03/08/2017)   Humiliation, Afraid, Rape, and Kick questionnaire    Fear of Current or Ex-Partner: No    Emotionally Abused: No    Physically Abused: No    Sexually Abused: No    Past Surgical History:  Procedure Laterality Date   BREAST BIOPSY Left 01/2011   benign/ Dr Lemar Livings   CESAREAN SECTION  1990   DILATION AND CURETTAGE OF UTERUS  10/19/2013   with hysteroscopy and polypectomy   HYSTEROSCOPY  2015   Dr Tiburcio Pea   wisdom teeth removal      Family History  Problem Relation Age of Onset   Hypertension Mother    Thyroid disease Mother    Heart attack Brother    Cancer Neg Hx        breast,colon,lung   Diabetes Neg Hx    Stroke Neg Hx     Allergies  Allergen Reactions   Sulfa Antibiotics    Sulfamethoxazole-Trimethoprim Anxiety    REACTION: anxious REACTION: anxious       Latest Ref Rng & Units 07/15/2015   11:19 AM 12/31/2014   12:00 AM 10/11/2013   12:43 PM  CBC  WBC 4.0 - 10.5 K/uL 5.1  5.2     6.1   Hemoglobin 12.0 - 15.0 g/dL 78.2  95.6     21.3   Hematocrit 36.0 - 46.0 % 39.0   39.4   Platelets 150.0 - 400.0 K/uL 295.0   335      This result is from an external source.      CMP     Component Value Date/Time   NA 139 08/25/2022 1115   NA 140 12/31/2014 0000   K 4.2 08/25/2022 1115   K 4.6 12/31/2014 0000   CL 102 08/25/2022 1115   CO2 28 08/25/2022 1115   GLUCOSE 88 08/25/2022 1115   BUN 9 08/25/2022 1115    CREATININE 0.71 08/25/2022 1115   CREATININE 0.63 12/31/2014 0000   CALCIUM 9.5 08/25/2022 1115   PROT 7.1 08/25/2022 1115   ALBUMIN 4.5 08/25/2022 1115   AST 13 08/25/2022 1115   AST 18 12/31/2014 0000   ALT 10 08/25/2022 1115   ALT 11  12/31/2014 0000   ALKPHOS 86 08/25/2022 1115   ALKPHOS 84 12/31/2014 0000   BILITOT 0.9 08/25/2022 1115   BILITOT 0.4 12/31/2014 0000   GFR 92.61 08/25/2022 1115   GFRNONAA 90.69 11/07/2009 0953     No results found.     Assessment & Plan:   1. Varicose veins of both lower extremities with pain Recommend  I have reviewed my previous  discussion with the patient regarding  varicose veins and why they cause symptoms. Patient will continue  wearing graduated compression stockings class 1 on a daily basis, beginning first thing in the morning and removing them in the evening.  The patient is CEAP [id number].  The patient has been wearing compression for more than 12 weeks with no or little benefit.  The patient has been exercising daily for more than 12 weeks. The patient has been elevating and taking OTC pain medications for more than 12 weeks.  None of these have have eliminated the pain related to the varicose veins and venous reflux or the discomfort regarding venous congestion.    In addition, behavioral modification including elevation during the day was again discussed and this will continue.  The patient has utilized over the counter pain medications and has been exercising.  However, at this time conservative therapy has not alleviated the patient's symptoms of leg pain and swelling  Recommend: laser ablation of the right and  left great saphenous veins to eliminate the symptoms of pain and swelling of the lower extremities caused by the severe superficial venous reflux disease.   2. Hyperlipidemia, unspecified hyperlipidemia type Continue statin as ordered and reviewed, no changes at this time   Current Outpatient Medications on File  Prior to Visit  Medication Sig Dispense Refill   Vitamin D, Ergocalciferol, (DRISDOL) 1.25 MG (50000 UNIT) CAPS capsule Take 1 capsule (50,000 Units total) by mouth every 7 (seven) days. 12 capsule 1   No current facility-administered medications on file prior to visit.    There are no Patient Instructions on file for this visit. No follow-ups on file.   Georgiana Spinner, NP

## 2023-02-25 ENCOUNTER — Other Ambulatory Visit: Payer: Self-pay | Admitting: Family Medicine

## 2023-02-25 DIAGNOSIS — E559 Vitamin D deficiency, unspecified: Secondary | ICD-10-CM

## 2023-03-03 ENCOUNTER — Telehealth (INDEPENDENT_AMBULATORY_CARE_PROVIDER_SITE_OTHER): Payer: Self-pay | Admitting: Vascular Surgery

## 2023-03-03 ENCOUNTER — Other Ambulatory Visit (INDEPENDENT_AMBULATORY_CARE_PROVIDER_SITE_OTHER): Payer: Self-pay

## 2023-03-03 MED ORDER — ALPRAZOLAM 0.5 MG PO TABS: ORAL_TABLET | ORAL | 0 refills | Status: DC

## 2023-03-03 NOTE — Telephone Encounter (Signed)
Patient is scheduled for bilateral laser ablations. The first is 2.20.25 and the second is 3.20.25. Patient will need a standard protocol RX ( times 2) sent to [facility] on [address]. Thank you.

## 2023-03-03 NOTE — Telephone Encounter (Signed)
sent

## 2023-03-25 ENCOUNTER — Other Ambulatory Visit (INDEPENDENT_AMBULATORY_CARE_PROVIDER_SITE_OTHER): Payer: Managed Care, Other (non HMO) | Admitting: Vascular Surgery

## 2023-04-01 ENCOUNTER — Encounter (INDEPENDENT_AMBULATORY_CARE_PROVIDER_SITE_OTHER): Payer: Managed Care, Other (non HMO)

## 2023-04-22 ENCOUNTER — Other Ambulatory Visit (INDEPENDENT_AMBULATORY_CARE_PROVIDER_SITE_OTHER): Payer: Managed Care, Other (non HMO) | Admitting: Vascular Surgery

## 2023-04-29 ENCOUNTER — Encounter (INDEPENDENT_AMBULATORY_CARE_PROVIDER_SITE_OTHER): Payer: Managed Care, Other (non HMO)

## 2023-05-20 ENCOUNTER — Ambulatory Visit (INDEPENDENT_AMBULATORY_CARE_PROVIDER_SITE_OTHER): Payer: Managed Care, Other (non HMO) | Admitting: Vascular Surgery

## 2023-06-22 ENCOUNTER — Encounter (INDEPENDENT_AMBULATORY_CARE_PROVIDER_SITE_OTHER): Payer: Self-pay

## 2023-07-02 ENCOUNTER — Ambulatory Visit: Admitting: Family Medicine

## 2023-07-02 ENCOUNTER — Encounter: Payer: Self-pay | Admitting: Family Medicine

## 2023-07-02 VITALS — BP 130/70 | HR 74 | Temp 98.4°F | Resp 20 | Ht 63.0 in | Wt 235.0 lb

## 2023-07-02 DIAGNOSIS — T148XXA Other injury of unspecified body region, initial encounter: Secondary | ICD-10-CM

## 2023-07-02 DIAGNOSIS — E038 Other specified hypothyroidism: Secondary | ICD-10-CM | POA: Diagnosis not present

## 2023-07-02 DIAGNOSIS — E785 Hyperlipidemia, unspecified: Secondary | ICD-10-CM

## 2023-07-02 DIAGNOSIS — R5383 Other fatigue: Secondary | ICD-10-CM

## 2023-07-02 DIAGNOSIS — Z6841 Body Mass Index (BMI) 40.0 and over, adult: Secondary | ICD-10-CM

## 2023-07-02 DIAGNOSIS — R7309 Other abnormal glucose: Secondary | ICD-10-CM

## 2023-07-02 DIAGNOSIS — E559 Vitamin D deficiency, unspecified: Secondary | ICD-10-CM | POA: Diagnosis not present

## 2023-07-02 DIAGNOSIS — E538 Deficiency of other specified B group vitamins: Secondary | ICD-10-CM

## 2023-07-02 DIAGNOSIS — I83893 Varicose veins of bilateral lower extremities with other complications: Secondary | ICD-10-CM

## 2023-07-02 DIAGNOSIS — Z1211 Encounter for screening for malignant neoplasm of colon: Secondary | ICD-10-CM

## 2023-07-02 LAB — LIPID PANEL
Cholesterol: 181 mg/dL (ref 0–200)
HDL: 47.5 mg/dL (ref >39.00)
LDL Cholesterol: 112 mg/dL — ABNORMAL HIGH (ref 0–99)
NonHDL: 133.96
Total CHOL/HDL Ratio: 4
Triglycerides: 112 mg/dL (ref 0.0–149.0)
VLDL: 22.4 mg/dL (ref 0.0–40.0)

## 2023-07-02 LAB — CBC WITH DIFFERENTIAL/PLATELET
Basophils Absolute: 0 10*3/uL (ref 0.0–0.1)
Basophils Relative: 0.7 % (ref 0.0–3.0)
Eosinophils Absolute: 0.2 10*3/uL (ref 0.0–0.7)
Eosinophils Relative: 4.2 % (ref 0.0–5.0)
HCT: 41.6 % (ref 36.0–46.0)
Hemoglobin: 13.8 g/dL (ref 12.0–15.0)
Lymphocytes Relative: 29.4 % (ref 12.0–46.0)
Lymphs Abs: 1.4 10*3/uL (ref 0.7–4.0)
MCHC: 33.1 g/dL (ref 30.0–36.0)
MCV: 90 fl (ref 78.0–100.0)
Monocytes Absolute: 0.4 10*3/uL (ref 0.1–1.0)
Monocytes Relative: 8.3 % (ref 3.0–12.0)
Neutro Abs: 2.7 10*3/uL (ref 1.4–7.7)
Neutrophils Relative %: 57.4 % (ref 43.0–77.0)
Platelets: 332 10*3/uL (ref 150.0–400.0)
RBC: 4.62 Mil/uL (ref 3.87–5.11)
RDW: 14.3 % (ref 11.5–15.5)
WBC: 4.7 10*3/uL (ref 4.0–10.5)

## 2023-07-02 LAB — COMPREHENSIVE METABOLIC PANEL WITH GFR
ALT: 10 U/L (ref 0–35)
AST: 13 U/L (ref 0–37)
Albumin: 4.5 g/dL (ref 3.5–5.2)
Alkaline Phosphatase: 78 U/L (ref 39–117)
BUN: 13 mg/dL (ref 6–23)
CO2: 29 meq/L (ref 19–32)
Calcium: 9.4 mg/dL (ref 8.4–10.5)
Chloride: 102 meq/L (ref 96–112)
Creatinine, Ser: 0.69 mg/dL (ref 0.40–1.20)
GFR: 93.96 mL/min (ref >60.00)
Glucose, Bld: 90 mg/dL (ref 70–99)
Potassium: 4.3 meq/L (ref 3.5–5.1)
Sodium: 140 meq/L (ref 135–145)
Total Bilirubin: 0.6 mg/dL (ref 0.2–1.2)
Total Protein: 7.1 g/dL (ref 6.0–8.3)

## 2023-07-02 LAB — VITAMIN B12: Vitamin B-12: 237 pg/mL (ref 211–911)

## 2023-07-02 LAB — VITAMIN D 25 HYDROXY (VIT D DEFICIENCY, FRACTURES): VITD: 27.64 ng/mL — ABNORMAL LOW (ref 30.00–100.00)

## 2023-07-02 LAB — TSH: TSH: 2.95 u[IU]/mL (ref 0.35–5.50)

## 2023-07-02 LAB — HEMOGLOBIN A1C: Hgb A1c MFr Bld: 5.7 % (ref 4.6–6.5)

## 2023-07-02 NOTE — Patient Instructions (Signed)
 It was a pleasure meeting you today. Thank you for allowing me to take part in your health care.  Our goals for today as we discussed include:  We will get some labs today.  If they are abnormal or we need to do something about them, I will call you.  If they are normal, I will send you a message on MyChart (if it is active) or a letter in the mail.  If you don't hear from us  in 2 weeks, please call the office at the number below.    Cologuard ordered. Please follow instructions when received.    This is a list of the screening recommended for you and due dates:  Health Maintenance  Topic Date Due   DTaP/Tdap/Td vaccine (2 - Tdap) 01/11/2017   COVID-19 Vaccine (1 - 2024-25 season) Never done   Cologuard (Stool DNA test)  06/20/2023   Flu Shot  09/03/2023   Mammogram  08/18/2024   Pap with HPV screening  01/02/2026   Hepatitis C Screening  Completed   HIV Screening  Completed   Zoster (Shingles) Vaccine  Completed   HPV Vaccine  Aged Out   Meningitis B Vaccine  Aged Out      If you have any questions or concerns, please do not hesitate to call the office at [phone].  I look forward to our next visit and until then take care and stay safe.  Regards,   Valli Gaw, MD   [facility]

## 2023-07-02 NOTE — Progress Notes (Signed)
 SUBJECTIVE:   Chief Complaint  Patient presents with   Fatigue   Abrasion    Left leg   HPI Presents for acute visit  Discussed the use of AI scribe software for clinical note transcription with the patient, who gave verbal consent to proceed.  History of Present Illness Haley Barnes is a 63 year old female who presents with fatigue and swelling of the ankles.  She has been experiencing fatigue and low energy levels for the past two to three weeks. Her sleep is generally good, with a routine of going to bed around 10:30 or 11:00 PM and waking up around 7:00 or 8:00 AM. She occasionally wakes up to use the bathroom but falls back asleep easily. No shortness of breath, chest pain, or mood changes. No weight changes, constipation, diarrhea, or blood in the stool. She does not consume coffee but enjoys sugary foods, which she acknowledges might contribute to her fatigue. She has a history of subclinical thyroid  issues but has not had recent sleep studies.  She experienced a fall a week ago, resulting in a cut on her leg. She has been applying Neosporin and covering it with a Band-Aid while at work. The wound is sore but improving, with no increase in redness or signs of infection. No fever. Her husband noted that the wound might look infected, but she feels it is getting better.  She experiences swelling in her ankles, particularly the left one, which worsens by the end of the day. She has a history of varicose veins. The swelling is better in the morning. She has not recently seen a cardiologist or vascular specialist.  Her nutrition includes home-cooked meals, but she admits to consuming junk food and sweets, especially at work. She does not take any multivitamins or supplements. She has two grandchildren and occasionally cares for one who is a year and a half old.   PERTINENT PMH / PSH: As above  OBJECTIVE:  BP 130/70   Pulse 74   Temp 98.4 F (36.9 C)   Resp 20   Ht 5\' 3"  (1.6  m)   Wt 235 lb (106.6 kg)   LMP 07/01/2015   SpO2 98%   BMI 41.63 kg/m    Physical Exam Vitals reviewed.  Constitutional:      General: She is not in acute distress.    Appearance: She is not ill-appearing.  HENT:     Head: Normocephalic.     Right Ear: Tympanic membrane, ear canal and external ear normal.     Left Ear: Tympanic membrane, ear canal and external ear normal.     Nose: Nose normal.     Mouth/Throat:     Mouth: Mucous membranes are moist.  Eyes:     Extraocular Movements: Extraocular movements intact.     Conjunctiva/sclera: Conjunctivae normal.     Pupils: Pupils are equal, round, and reactive to light.  Neck:     Thyroid : No thyromegaly or thyroid  tenderness.     Vascular: No carotid bruit.  Cardiovascular:     Rate and Rhythm: Normal rate and regular rhythm.     Pulses: Normal pulses.     Heart sounds: Normal heart sounds.  Pulmonary:     Effort: Pulmonary effort is normal.     Breath sounds: Normal breath sounds.  Abdominal:     General: Bowel sounds are normal. There is no distension.     Palpations: Abdomen is soft.     Tenderness: There is no  abdominal tenderness. There is no right CVA tenderness, left CVA tenderness, guarding or rebound.  Musculoskeletal:        General: Normal range of motion.     Cervical back: Normal range of motion.     Right lower leg: No edema.     Left lower leg: No edema.  Lymphadenopathy:     Cervical: No cervical adenopathy.  Skin:    Capillary Refill: Capillary refill takes less than 2 seconds.     Findings: Abrasion and erythema present.  Neurological:     General: No focal deficit present.     Mental Status: She is alert and oriented to person, place, and time. Mental status is at baseline.     Motor: No weakness.  Psychiatric:        Mood and Affect: Mood normal.        Behavior: Behavior normal.        Thought Content: Thought content normal.        Judgment: Judgment normal.           07/02/2023    11:48 AM 11/06/2022   11:32 AM 08/24/2022    9:32 AM  Depression screen PHQ 2/9  Decreased Interest 0 0 0  Down, Depressed, Hopeless 0 0 0  PHQ - 2 Score 0 0 0  Altered sleeping 0 0   Tired, decreased energy 1 1   Change in appetite 0 0   Feeling bad or failure about yourself  0 0   Trouble concentrating 0 0   Moving slowly or fidgety/restless 0 0   Suicidal thoughts 0 0   PHQ-9 Score 1 1   Difficult doing work/chores Not difficult at all Not difficult at all       07/02/2023   11:48 AM 11/06/2022   11:34 AM 08/24/2022    9:32 AM  GAD 7 : Generalized Anxiety Score  Nervous, Anxious, on Edge 0 1 0  Control/stop worrying 0 2 0  Worry too much - different things 0 0 0  Trouble relaxing 0 0 0  Restless 0 0 0  Easily annoyed or irritable 0 0 0  Afraid - awful might happen 0 0 0  Total GAD 7 Score 0 3 0  Anxiety Difficulty Not difficult at all Not difficult at all     ASSESSMENT/PLAN:  Other fatigue Assessment & Plan: Fatigue for 2-3 weeks without associated symptoms. Adequate sleep but possible sleep apnea. Differential includes thyroid  dysfunction, anemia, vitamin D /B12 deficiency, dietary factors. - Order blood work: thyroid  function, CBC, vitamin D , vitamin B12, iron levels. - Consider sleep study if blood work inconclusive. - Advise monitoring dietary sugar intake.  Orders: -     CBC with Differential/Platelet -     Comprehensive metabolic panel with GFR  Colon cancer screening -     Cologuard  Vitamin B 12 deficiency Assessment & Plan: Check Vitamin B 12 level  Orders: -     Vitamin B12  Vitamin D  deficiency Assessment & Plan: On weekly Vitamin D  supplementation. -Check Vitamin D  level  Orders: -     VITAMIN D  25 Hydroxy (Vit-D Deficiency, Fractures) -     Vitamin D  (Ergocalciferol ); Take 1 capsule (50,000 Units total) by mouth every 7 (seven) days.  Dispense: 12 capsule; Refill: 3  Subclinical hypothyroidism Assessment & Plan: Increase in fatigue, weight  gain Check TSH  Orders: -     TSH  Abnormal glucose -     Hemoglobin A1c  Hyperlipidemia, unspecified  hyperlipidemia type Assessment & Plan: Check lipids  Orders: -     Lipid panel  Skin abrasion Assessment & Plan: Leg wound healing with granulation tissue, no infection signs. Neosporin may delay healing, peroxide affected healthy tissue. - Advise against Neosporin; allow wound to dry. - Use nonstick bandage if needed. - Monitor for infection signs.   Morbid obesity (HCC) Assessment & Plan: Increased BMI. Has increased snacking at work. Likely contributing to fatigue  Encouraged portion control, healthy diet and increasing activity   Varicose veins of both legs with edema Assessment & Plan: Ankle swelling, especially left, likely due to varicose veins. Reduces with elevation. Increased sodium intake may worsen swelling. Varicose veins contribute to ankle swelling. Surgery considered but postponed due to insurance. Discussed future surgery and financial planning.  - Recommend compression stockings during the day. - Advise reducing sodium intake.     PDMP reviewed  Return if symptoms worsen or fail to improve, for PCP.  Valli Gaw, MD

## 2023-07-06 ENCOUNTER — Encounter: Payer: Self-pay | Admitting: Family Medicine

## 2023-07-06 ENCOUNTER — Ambulatory Visit: Payer: Self-pay | Admitting: Family Medicine

## 2023-07-06 DIAGNOSIS — R7309 Other abnormal glucose: Secondary | ICD-10-CM | POA: Insufficient documentation

## 2023-07-06 DIAGNOSIS — T148XXA Other injury of unspecified body region, initial encounter: Secondary | ICD-10-CM | POA: Insufficient documentation

## 2023-07-06 DIAGNOSIS — R5383 Other fatigue: Secondary | ICD-10-CM | POA: Insufficient documentation

## 2023-07-06 MED ORDER — VITAMIN D (ERGOCALCIFEROL) 1.25 MG (50000 UNIT) PO CAPS: 50000.0000 [IU] | ORAL_CAPSULE | ORAL | 3 refills | Status: AC

## 2023-07-06 NOTE — Assessment & Plan Note (Signed)
 On weekly Vitamin D  supplementation. -Check Vitamin D  level

## 2023-07-06 NOTE — Assessment & Plan Note (Signed)
 Check Vitamin B 12 level

## 2023-07-06 NOTE — Assessment & Plan Note (Signed)
 Increased BMI. Has increased snacking at work. Likely contributing to fatigue  Encouraged portion control, healthy diet and increasing activity

## 2023-07-06 NOTE — Assessment & Plan Note (Signed)
 Increase in fatigue, weight gain Check TSH

## 2023-07-06 NOTE — Assessment & Plan Note (Signed)
 Leg wound healing with granulation tissue, no infection signs. Neosporin may delay healing, peroxide affected healthy tissue. - Advise against Neosporin; allow wound to dry. - Use nonstick bandage if needed. - Monitor for infection signs.

## 2023-07-06 NOTE — Assessment & Plan Note (Signed)
 Ankle swelling, especially left, likely due to varicose veins. Reduces with elevation. Increased sodium intake may worsen swelling. Varicose veins contribute to ankle swelling. Surgery considered but postponed due to insurance. Discussed future surgery and financial planning.  - Recommend compression stockings during the day. - Advise reducing sodium intake.

## 2023-07-06 NOTE — Assessment & Plan Note (Signed)
 Check lipids

## 2023-07-06 NOTE — Assessment & Plan Note (Signed)
 Fatigue for 2-3 weeks without associated symptoms. Adequate sleep but possible sleep apnea. Differential includes thyroid  dysfunction, anemia, vitamin D /B12 deficiency, dietary factors. - Order blood work: thyroid  function, CBC, vitamin D , vitamin B12, iron levels. - Consider sleep study if blood work inconclusive. - Advise monitoring dietary sugar intake.

## 2023-07-22 LAB — COLOGUARD: COLOGUARD: NEGATIVE

## 2023-08-24 ENCOUNTER — Ambulatory Visit: Admitting: Obstetrics and Gynecology

## 2023-08-24 ENCOUNTER — Telehealth: Payer: Self-pay

## 2023-08-24 ENCOUNTER — Other Ambulatory Visit: Payer: Self-pay | Admitting: Obstetrics and Gynecology

## 2023-08-24 DIAGNOSIS — N950 Postmenopausal bleeding: Secondary | ICD-10-CM

## 2023-08-24 NOTE — Telephone Encounter (Signed)
 Pt has appt today for vaginal bleeding. Called to get more information. Pt says she has been having on/off spotting for the last couple of months. Has noticed it when wiping and sometimes in toilet. Denies cramping or the bleeding being a flow.

## 2023-08-24 NOTE — Progress Notes (Signed)
 GYN u/s for PMB, hx of possible polyp.

## 2023-08-24 NOTE — Telephone Encounter (Signed)
 Pt with similar sx 12/22 with neg EMB, possible polyp. Will rechck GYN u/s and then pt to f/u with MD depending on results.

## 2023-09-15 ENCOUNTER — Ambulatory Visit

## 2023-09-15 DIAGNOSIS — N950 Postmenopausal bleeding: Secondary | ICD-10-CM

## 2023-09-16 ENCOUNTER — Encounter: Payer: Self-pay | Admitting: Obstetrics and Gynecology

## 2024-03-14 ENCOUNTER — Encounter
